# Patient Record
Sex: Female | Born: 1985 | Race: Asian | Hispanic: No | Marital: Married | Smoking: Never smoker
Health system: Southern US, Community
[De-identification: ages and names within clinical notes are randomized; demographics above are authoritative.]

## PROBLEM LIST (undated history)

## (undated) DIAGNOSIS — Z789 Other specified health status: Secondary | ICD-10-CM

## (undated) HISTORY — PX: OTHER SURGICAL HISTORY: SHX169

## (undated) HISTORY — PX: BREAST SURGERY: SHX581

---

## 2008-02-10 HISTORY — PX: BREAST SURGERY: SHX581

## 2014-02-08 ENCOUNTER — Ambulatory Visit (INDEPENDENT_AMBULATORY_CARE_PROVIDER_SITE_OTHER): Payer: Managed Care, Other (non HMO) | Admitting: Physician Assistant

## 2014-02-08 VITALS — BP 104/78 | HR 80 | Temp 98.3°F | Resp 16 | Ht 62.25 in | Wt 162.8 lb

## 2014-02-08 DIAGNOSIS — Z23 Encounter for immunization: Secondary | ICD-10-CM

## 2014-02-08 DIAGNOSIS — Z0000 Encounter for general adult medical examination without abnormal findings: Secondary | ICD-10-CM

## 2014-02-08 DIAGNOSIS — E663 Overweight: Secondary | ICD-10-CM

## 2014-02-08 LAB — POCT URINALYSIS DIPSTICK
BILIRUBIN UA: NEGATIVE
Blood, UA: NEGATIVE
GLUCOSE UA: NEGATIVE
KETONES UA: NEGATIVE
LEUKOCYTES UA: NEGATIVE
NITRITE UA: NEGATIVE
PROTEIN UA: NEGATIVE
Spec Grav, UA: 1.01
UROBILINOGEN UA: 0.2
pH, UA: 7

## 2014-02-08 LAB — LIPID PANEL
CHOLESTEROL: 204 mg/dL — AB (ref 0–200)
HDL: 53 mg/dL (ref >39)
LDL CALC: 102 mg/dL — AB (ref 0–99)
TRIGLYCERIDES: 243 mg/dL — AB (ref <150)
Total CHOL/HDL Ratio: 3.8 Ratio
VLDL: 49 mg/dL — ABNORMAL HIGH (ref 0–40)

## 2014-02-08 LAB — CBC WITH DIFFERENTIAL/PLATELET
BASOS PCT: 0 % (ref 0–1)
Basophils Absolute: 0 10*3/uL (ref 0.0–0.1)
Eosinophils Absolute: 0.1 10*3/uL (ref 0.0–0.7)
Eosinophils Relative: 1 % (ref 0–5)
HCT: 37.9 % (ref 36.0–46.0)
HEMOGLOBIN: 12.9 g/dL (ref 12.0–15.0)
Lymphocytes Relative: 36 % (ref 12–46)
Lymphs Abs: 3.1 10*3/uL (ref 0.7–4.0)
MCH: 25.1 pg — ABNORMAL LOW (ref 26.0–34.0)
MCHC: 34 g/dL (ref 30.0–36.0)
MCV: 73.9 fL — ABNORMAL LOW (ref 78.0–100.0)
MONO ABS: 0.4 10*3/uL (ref 0.1–1.0)
MONOS PCT: 5 % (ref 3–12)
MPV: 10.3 fL (ref 8.6–12.4)
Neutro Abs: 4.9 10*3/uL (ref 1.7–7.7)
Neutrophils Relative %: 58 % (ref 43–77)
Platelets: 389 10*3/uL (ref 150–400)
RBC: 5.13 MIL/uL — ABNORMAL HIGH (ref 3.87–5.11)
RDW: 14.1 % (ref 11.5–15.5)
WBC: 8.5 10*3/uL (ref 4.0–10.5)

## 2014-02-08 LAB — COMPREHENSIVE METABOLIC PANEL
ALBUMIN: 3.8 g/dL (ref 3.5–5.2)
ALT: 22 U/L (ref 0–35)
AST: 16 U/L (ref 0–37)
Alkaline Phosphatase: 96 U/L (ref 39–117)
BILIRUBIN TOTAL: 0.4 mg/dL (ref 0.2–1.2)
BUN: 8 mg/dL (ref 6–23)
CO2: 25 mEq/L (ref 19–32)
Calcium: 9.4 mg/dL (ref 8.4–10.5)
Chloride: 104 mEq/L (ref 96–112)
Creat: 0.9 mg/dL (ref 0.50–1.10)
Glucose, Bld: 86 mg/dL (ref 70–99)
Potassium: 4.3 mEq/L (ref 3.5–5.3)
SODIUM: 138 meq/L (ref 135–145)
TOTAL PROTEIN: 7.7 g/dL (ref 6.0–8.3)

## 2014-02-08 NOTE — Patient Instructions (Signed)
Try to walk for 30 minutes 3-4 times a day and work on weight loss. You only need a pap smear every 3 years. I will call you with your lab results within 5-10 days. Return with any concerns.

## 2014-02-08 NOTE — Progress Notes (Signed)
The patient was discussed with me and I agree with the diagnosis and treatment plan.  

## 2014-02-08 NOTE — Progress Notes (Signed)
Subjective:    Patient ID: Margie EgeSanjukta Mallik, female    DOB: 02/10/1985, 28 y.o.   MRN: [id number]  HPI  This is a 28 year old female with no PMH who is presenting for annual exam.   Complaints: none. States she had some right breast pain 6 months that has improved since changing bras. Wants her breasts to be checked.  LMP: one week ago.  Contraception: yes on OCPs- cannot recall name of OCP. Last pap: last pap 6 months and normal. Never had abnormal. Immunizations: does not take injections. (returned after left facility and had decided she wanted the flu vaccine) Dentist: has dental insurance but has not had an exam in many years. Eye: regular exams. Diet/exercise: eats healthy, does not exercise. Fam hx: Mom with HTN. No history of heart disease or cancers. Substance use:she does not smoke, uses rare alcohol, no recreational drugs.   Review of Systems  Constitutional: Negative.   HENT: Negative.   Eyes: Negative.   Respiratory: Negative.   Cardiovascular: Negative.   Gastrointestinal: Negative.   Endocrine: Negative.   Genitourinary: Negative.   Musculoskeletal: Negative.   Skin: Negative.   Allergic/Immunologic: Negative.   Neurological: Negative.   Psychiatric/Behavioral: Negative.    There are no active problems to display for this patient.  Home meds: unknown OCP  Allergies  Allergen Reactions  . Penicillins    Patient's social and family history were reviewed.     Objective:   Physical Exam  Constitutional: She is oriented to person, place, and time. She appears well-developed and well-nourished. No distress.  HENT:  Head: Normocephalic and atraumatic.  Right Ear: Hearing, tympanic membrane, external ear and ear canal normal.  Left Ear: Hearing, tympanic membrane, external ear and ear canal normal.  Nose: Nose normal.  Mouth/Throat: Uvula is midline, oropharynx is clear and moist and mucous membranes are normal.  Eyes: Conjunctivae, EOM and lids are  normal. Pupils are equal, round, and reactive to light. Right eye exhibits no discharge. Left eye exhibits no discharge. No scleral icterus.  Cardiovascular: Normal rate, regular rhythm, normal heart sounds, intact distal pulses and normal pulses.   No murmur heard. Pulmonary/Chest: Effort normal and breath sounds normal. No respiratory distress. She has no wheezes. She has no rhonchi. She has no rales.  Abdominal: Soft. Normal appearance and bowel sounds are normal. There is no tenderness.  Genitourinary: No breast swelling, tenderness, discharge or bleeding.  Musculoskeletal: Normal range of motion.  Lymphadenopathy:    She has no cervical adenopathy.  Neurological: She is alert and oriented to person, place, and time. She has normal strength and normal reflexes. No sensory deficit.  Skin: Skin is warm, dry and intact. No lesion and no rash noted.  Psychiatric: She has a normal mood and affect. Her speech is normal and behavior is normal. Thought content normal.   Results for orders placed or performed in visit on 02/08/14  POCT urinalysis dipstick  Result Value Ref Range   Color, UA yellow    Clarity, UA clear    Glucose, UA neg    Bilirubin, UA neg    Ketones, UA neg    Spec Grav, UA 1.010    Blood, UA neg    pH, UA 7.0    Protein, UA neg    Urobilinogen, UA 0.2    Nitrite, UA neg    Leukocytes, UA Negative       Assessment & Plan:  1. Annual physical exam 2. Overweight Advised that she  get regular dental exams and exercise 30 minutes 3-4 times a week. She denies Tdap. She is otherwise up to date on preventative medicine. She will return with any concerns.   - CBC with Differential - POCT urinalysis dipstick - Comprehensive metabolic panel - Lipid panel - TSH  3. Need for prophylactic vaccination and inoculation against influenza - Flu Vaccine QUAD 36+ mos IM   Roswell MinersNicole V. Dyke BrackettBush, PA-C, MHS [facility]  02/08/2014

## 2014-02-09 LAB — TSH: TSH: 2.127 u[IU]/mL (ref 0.350–4.500)

## 2014-02-13 ENCOUNTER — Encounter: Payer: Self-pay | Admitting: Radiology

## 2014-02-28 ENCOUNTER — Ambulatory Visit (INDEPENDENT_AMBULATORY_CARE_PROVIDER_SITE_OTHER): Payer: Managed Care, Other (non HMO) | Admitting: Family Medicine

## 2014-02-28 VITALS — BP 118/68 | HR 84 | Temp 98.5°F | Resp 17 | Ht 62.0 in | Wt 166.0 lb

## 2014-02-28 DIAGNOSIS — K648 Other hemorrhoids: Secondary | ICD-10-CM

## 2014-02-28 DIAGNOSIS — K625 Hemorrhage of anus and rectum: Secondary | ICD-10-CM

## 2014-02-28 MED ORDER — HYDROCORTISONE 1 % RE CREA: TOPICAL_CREAM | RECTAL | Status: DC

## 2014-02-28 NOTE — Patient Instructions (Signed)
We are going to treat you for a hemorrhoid with hydrocortisone cream as needed.  It is also important that you keep your stools soft let me know if you do not feel better!

## 2014-02-28 NOTE — Progress Notes (Signed)
[facility] [address], [place] [address] [phone]  Date:  02/28/2014   Name:  Tammy Mcdowell   DOB:  08/21/1985   MRN:  [id number]  PCP:  No PCP Per Patient    Chief Complaint: fistula; Blood In Stools; and Constipation   History of Present Illness:  Tammy Mcdowell is a 29 y.o. very pleasant female patient who presents with the following:  Here today with a bowel concern.   She and her husband report that she has a "fistula" which was first noted about 5 years ago and resolved with "ointment."   This seems to be related to constipation.   Questioned her further about this "fistula.'  She did not reuqire any surgery and treated this with external ointments.  It seems uncertain if she really had a fistula- perhaps this was a hemorrhoid.  She was never dx with a fistula but they just decided this must be what she had based on internet research.  It seems very doubtful that she actually has or had a fistula.   She has noted some rectal bleeding for the last week - bright red- with BM only.  It seemed to occur with hard stools at first, but now her stools are softer so it is better.  She has noted this problem on occasion for the last several years, it always would go aw  She does not have a GI doctor.     She has her menses currently No vomiting  She is married but does not have children   There are no active problems to display for this patient.   No past medical history on file.  Past Surgical History  Procedure Laterality Date  . Breast surgery      History  Substance Use Topics  . Smoking status: Never Smoker   . Smokeless tobacco: Not on file  . Alcohol Use: Not on file    No family history on file.  Allergies  Allergen Reactions  . Penicillins     Medication list has been reviewed and updated.  No current outpatient prescriptions on file prior to visit.   No current facility-administered medications on file prior to visit.     Review of Systems:  As per HPI- otherwise negative. Explained what a fistula actually is- It does not seem that she had one   Physical Examination: Filed Vitals:   02/28/14 1154  BP: 118/68  Pulse: 84  Temp: 98.5 F (36.9 C)  Resp: 17   Filed Vitals:   02/28/14 1154  Height: 5\' 2"  (1.575 m)  Weight: 166 lb (75.297 kg)   Body mass index is 30.35 kg/(m^2). Ideal Body Weight: Weight in (lb) to have BMI = 25: 136.4  GEN: WDWN, NAD, Non-toxic, A & O x 3 HEENT: Atraumatic, Normocephalic. Neck supple. No masses, No LAD. Ears and Nose: No external deformity. CV: RRR, No M/G/R. No JVD. No thrill. No extra heart sounds. PULM: CTA B, no wheezes, crackles, rhonchi. No retractions. No resp. distress. No accessory muscle use. ABD: S, NT, ND, +BS. No rebound. No HSM.  Benign exam EXTR: No c/c/e NEURO Normal gait.  PSYCH: Normally interactive. Conversant. Not depressed or anxious appearing.  Calm demeanor.  Rectal: tiny hemorrhoids at 3:00 and 9:00.  She is not able to tolerate more than a fingertip rectal exam due to pain but there is no gross bleeding  Assessment and Plan: Other hemorrhoids - Plan: hydrocortisone (PROCTOCORT) 1 % CREA, Ambulatory  referral to Gastroenterology  Rectal bleeding - Plan: Ambulatory referral to Gastroenterology  Will refer her to GI as she has noted intermittent rectal bleeding for a few years (likely due to hemorrhoids).  proctocort to use as needed, encouraged her to keep her stools soft to prevent pain and discourage bleeding.   Signed Abbe Amsterdam, MD

## 2014-03-01 MED ORDER — HYDROCORTISONE 2.5 % RE CREA: 1.0000 "application " | TOPICAL_CREAM | Freq: Two times a day (BID) | RECTAL | Status: DC

## 2014-03-01 NOTE — Addendum Note (Signed)
Addended by: Eddie CandleLUCK, Jhaden Pizzuto L on: 03/01/2014 11:35 AM   Modules accepted: Orders

## 2014-07-16 ENCOUNTER — Ambulatory Visit (INDEPENDENT_AMBULATORY_CARE_PROVIDER_SITE_OTHER): Payer: Managed Care, Other (non HMO) | Admitting: Family Medicine

## 2014-07-16 VITALS — BP 100/60 | HR 83 | Temp 97.5°F | Resp 16 | Ht 62.0 in | Wt 165.0 lb

## 2014-07-16 DIAGNOSIS — S060X1A Concussion with loss of consciousness of 30 minutes or less, initial encounter: Secondary | ICD-10-CM

## 2014-07-16 DIAGNOSIS — R51 Headache: Secondary | ICD-10-CM | POA: Diagnosis not present

## 2014-07-16 DIAGNOSIS — R110 Nausea: Secondary | ICD-10-CM

## 2014-07-16 DIAGNOSIS — R519 Headache, unspecified: Secondary | ICD-10-CM
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
## 2014-07-16 NOTE — Patient Instructions (Signed)
Watch for any sign of neurologic changes as discussed.  Acetaminophen 500 mg 2 pills 3 times daily if needed for headache  Concussion A concussion, or closed-head injury, is a brain injury caused by a direct blow to the head or by a quick and sudden movement (jolt) of the head or neck. Concussions are usually not life-threatening. Even so, the effects of a concussion can be serious. If you have had a concussion before, you are more likely to experience concussion-like symptoms after a direct blow to the head.  CAUSES  Direct blow to the head, such as from running into another player during a soccer game, being hit in a fight, or hitting your head on a hard surface.  A jolt of the head or neck that causes the brain to move back and forth inside the skull, such as in a car crash. SIGNS AND SYMPTOMS The signs of a concussion can be hard to notice. Early on, they may be missed by you, family members, and health care providers. You may look fine but act or feel differently. Symptoms are usually temporary, but they may last for days, weeks, or even longer. Some symptoms may appear right away while others may not show up for hours or days. Every head injury is different. Symptoms include:  Mild to moderate headaches that will not go away.  A feeling of pressure inside your head.  Having more trouble than usual:  Learning or remembering things you have heard.  Answering questions.  Paying attention or concentrating.  Organizing daily tasks.  Making decisions and solving problems.  Slowness in thinking, acting or reacting, speaking, or reading.  Getting lost or being easily confused.  Feeling tired all the time or lacking energy (fatigued).  Feeling drowsy.  Sleep disturbances.  Sleeping more than usual.  Sleeping less than usual.  Trouble falling asleep.  Trouble sleeping (insomnia).  Loss of balance or feeling lightheaded or dizzy.  Nausea or vomiting.  Numbness or  tingling.  Increased sensitivity to:  Sounds.  Lights.  Distractions.  Vision problems or eyes that tire easily.  Diminished sense of taste or smell.  Ringing in the ears.  Mood changes such as feeling sad or anxious.  Becoming easily irritated or angry for little or no reason.  Lack of motivation.  Seeing or hearing things other people do not see or hear (hallucinations). DIAGNOSIS Your health care provider can usually diagnose a concussion based on a description of your injury and symptoms. He or she will ask whether you passed out (lost consciousness) and whether you are having trouble remembering events that happened right before and during your injury. Your evaluation might include:  A brain scan to look for signs of injury to the brain. Even if the test shows no injury, you may still have a concussion.  Blood tests to be sure other problems are not present. TREATMENT  Concussions are usually treated in an emergency department, in urgent care, or at a clinic. You may need to stay in the hospital overnight for further treatment.  Tell your health care provider if you are taking any medicines, including prescription medicines, over-the-counter medicines, and natural remedies. Some medicines, such as blood thinners (anticoagulants) and aspirin, may increase the chance of complications. Also tell your health care provider whether you have had alcohol or are taking illegal drugs. This information may affect treatment.  Your health care provider will send you home with important instructions to follow.  How fast you will recover  from a concussion depends on many factors. These factors include how severe your concussion is, what part of your brain was injured, your age, and how healthy you were before the concussion.  Most people with mild injuries recover fully. Recovery can take time. In general, recovery is slower in older persons. Also, persons who have had a concussion in  the past or have other medical problems may find that it takes longer to recover from their current injury. HOME CARE INSTRUCTIONS General Instructions  Carefully follow the directions your health care provider gave you.  Only take over-the-counter or prescription medicines for pain, discomfort, or fever as directed by your health care provider.  Take only those medicines that your health care provider has approved.  Do not drink alcohol until your health care provider says you are well enough to do so. Alcohol and certain other drugs may slow your recovery and can put you at risk of further injury.  If it is harder than usual to remember things, write them down.  If you are easily distracted, try to do one thing at a time. For example, do not try to watch TV while fixing dinner.  Talk with family members or close friends when making important decisions.  Keep all follow-up appointments. Repeated evaluation of your symptoms is recommended for your recovery.  Watch your symptoms and tell others to do the same. Complications sometimes occur after a concussion. Older adults with a brain injury may have a higher risk of serious complications, such as a blood clot on the brain.  Tell your teachers, school nurse, school counselor, coach, athletic trainer, or work Freight forwarder about your injury, symptoms, and restrictions. Tell them about what you can or cannot do. They should watch for:  Increased problems with attention or concentration.  Increased difficulty remembering or learning new information.  Increased time needed to complete tasks or assignments.  Increased irritability or decreased ability to cope with stress.  Increased symptoms.  Rest. Rest helps the brain to heal. Make sure you:  Get plenty of sleep at night. Avoid staying up late at night.  Keep the same bedtime hours on weekends and weekdays.  Rest during the day. Take daytime naps or rest breaks when you feel  tired.  Limit activities that require a lot of thought or concentration. These include:  Doing homework or job-related work.  Watching TV.  Working on the computer.  Avoid any situation where there is potential for another head injury (football, hockey, soccer, basketball, martial arts, downhill snow sports and horseback riding). Your condition will get worse every time you experience a concussion. You should avoid these activities until you are evaluated by the appropriate follow-up health care providers. Returning To Your Regular Activities You will need to return to your normal activities slowly, not all at once. You must give your body and brain enough time for recovery.  Do not return to sports or other athletic activities until your health care provider tells you it is safe to do so.  Ask your health care provider when you can drive, ride a bicycle, or operate heavy machinery. Your ability to react may be slower after a brain injury. Never do these activities if you are dizzy.  Ask your health care provider about when you can return to work or school. Preventing Another Concussion It is very important to avoid another brain injury, especially before you have recovered. In rare cases, another injury can lead to permanent brain damage, brain swelling, or  death. The risk of this is greatest during the first 7-10 days after a head injury. Avoid injuries by:  Wearing a seat belt when riding in a car.  Drinking alcohol only in moderation.  Wearing a helmet when biking, skiing, skateboarding, skating, or doing similar activities.  Avoiding activities that could lead to a second concussion, such as contact or recreational sports, until your health care provider says it is okay.  Taking safety measures in your home.  Remove clutter and tripping hazards from floors and stairways.  Use grab bars in bathrooms and handrails by stairs.  Place non-slip mats on floors and in  bathtubs.  Improve lighting in dim areas. SEEK MEDICAL CARE IF:  You have increased problems paying attention or concentrating.  You have increased difficulty remembering or learning new information.  You need more time to complete tasks or assignments than before.  You have increased irritability or decreased ability to cope with stress.  You have more symptoms than before. Seek medical care if you have any of the following symptoms for more than 2 weeks after your injury:  Lasting (chronic) headaches.  Dizziness or balance problems.  Nausea.  Vision problems.  Increased sensitivity to noise or light.  Depression or mood swings.  Anxiety or irritability.  Memory problems.  Difficulty concentrating or paying attention.  Sleep problems.  Feeling tired all the time. SEEK IMMEDIATE MEDICAL CARE IF:  You have severe or worsening headaches. These may be a sign of a blood clot in the brain.  You have weakness (even if only in one hand, leg, or part of the face).  You have numbness.  You have decreased coordination.  You vomit repeatedly.  You have increased sleepiness.  One pupil is larger than the other.  You have convulsions.  You have slurred speech.  You have increased confusion. This may be a sign of a blood clot in the brain.  You have increased restlessness, agitation, or irritability.  You are unable to recognize people or places.  You have neck pain.  It is difficult to wake you up.  You have unusual behavior changes.  You lose consciousness. MAKE SURE YOU:  Understand these instructions.  Will watch your condition.  Will get help right away if you are not doing well or get worse. Document Released: 04/18/2003 Document Revised: 01/31/2013 Document Reviewed: 08/18/2012 [facility] Patient Information 2015 [place], [place]. This information is not intended to replace advice given to you by your health care provider. Make sure you discuss any  questions you have with your health care provider.

## 2014-07-18 ENCOUNTER — Ambulatory Visit (INDEPENDENT_AMBULATORY_CARE_PROVIDER_SITE_OTHER): Payer: Managed Care, Other (non HMO) | Admitting: Physician Assistant

## 2014-07-18 VITALS — BP 110/60 | HR 78 | Temp 98.4°F | Resp 14 | Ht 62.25 in | Wt 167.0 lb

## 2014-07-18 DIAGNOSIS — Z09 Encounter for follow-up examination after completed treatment for conditions other than malignant neoplasm: Secondary | ICD-10-CM

## 2014-07-18 DIAGNOSIS — F0781 Postconcussional syndrome: Secondary | ICD-10-CM | POA: Diagnosis not present

## 2014-07-18 NOTE — Patient Instructions (Signed)
Please continue to stay hydrated with water and eat well. Cognitive rest--avoiding loud noise, stressors, visual stimuli--has been very beneficial to you. Please contact us if you have questions or concerns.

## 2014-07-21 ENCOUNTER — Encounter: Payer: Self-pay | Admitting: Physician Assistant

## 2014-07-21 NOTE — Progress Notes (Signed)
[facility] [address], [place] [address] [phone]  Date:  07/18/2014   Name:  Tammy Mcdowell   DOB:  1985-08-16   MRN:  [id number]  PCP:  No PCP Per Patient    History of Present Illness:  Tammy Mcdowell is a 29 y.o. female patient who presents to [facility] for follow-up after a fall in her home which resulted in some postconcussive symptoms. She has been off work for the last 48 hours. Patient reports that she is feeling a lot better. She has been resting very well. She states that she has some moments of disequilibrium, but had it has not resulted in any falls. She is no longer having headaches. And she does not have the nausea as before. Husband confirms that she has not been displaying disorientation, confusion, slurred speech, agitation, dizziness, cognitive delay, or any falls. She is eating normally. She is concerned about returning to work with this disequilibrium and having to walk from the parking lot into her job and sitting at the computer for long hours of time.   There are no active problems to display for this patient.   History reviewed. No pertinent past medical history.  Past Surgical History  Procedure Laterality Date  . Breast surgery    . Breast surgery  2010    History  Substance Use Topics  . Smoking status: Never Smoker   . Smokeless tobacco: Not on file  . Alcohol Use: Not on file    History reviewed. No pertinent family history.  Allergies  Allergen Reactions  . Penicillins     Medication list has been reviewed and updated.  Current Outpatient Prescriptions on File Prior to Visit  Medication Sig Dispense Refill  . hydrocortisone (PROCTOCORT) 1 % CREA Apply 2-4x a day as needed for hemorrhoids (Patient not taking: Reported on 07/16/2014) 1 Tube 1  . hydrocortisone (PROCTOCREAM-HC) 2.5 % rectal cream Place 1 application rectally 2 (two) times daily. (Patient not taking: Reported on 07/16/2014) 30 g 0   No current  facility-administered medications on file prior to visit.    ROS ROS otherwise unremarkable unless listed above  Physical Examination: BP 110/60 mmHg  Pulse 78  Temp(Src) 98.4 F (36.9 C) (Oral)  Resp 14  Ht 5' 2.25" (1.581 m)  Wt 167 lb (75.751 kg)  BMI 30.31 kg/m2  SpO2 98%  LMP 07/14/2014 Ideal Body Weight: Weight in (lb) to have BMI = 25: 137.5  Physical Exam  Constitutional: She is oriented to person, place, and time. She appears well-developed and well-nourished.  HENT:  Head: Normocephalic and atraumatic.  No tenderness   Eyes: Conjunctivae and EOM are normal. Pupils are equal, round, and reactive to light. Right eye exhibits normal extraocular motion and no nystagmus. Left eye exhibits normal extraocular motion and no nystagmus.  Neck: Normal range of motion.  Cardiovascular: Normal rate, regular rhythm and normal heart sounds.  Exam reveals no friction rub.   No murmur heard. Pulmonary/Chest: Effort normal and breath sounds normal. No respiratory distress.  Neurological: She is alert and oriented to person, place, and time. No cranial nerve deficit. She displays a negative Romberg sign. Coordination normal.  Normal finger to nose.  Normal H to S.  Tandem gait intact.    Skin: Skin is warm and dry.  Psychiatric: She has a normal mood and affect. Her speech is normal and behavior is normal. Judgment and thought content normal. Cognition and memory are normal. She is attentive.  Assessment and Plan: 29 year old female who is otherwise healthy is here today for follow-up of a fall that occurred about 3 days ago. She is cognitively and neurologically sound with great improvement. At this time, she is able to go to work, however due to the mild disequilibrium I have advised that she continue her cognitive rest. She is very concerned that she needs to return to work, however can do work at home. I have sent a letter with her advising my recommendation that she work from  home for the next 2 days, where she can be able to take more breaks, avoid heavy stressors, and a loud work-noise environment. This will be for the next 2 days and then for the following 2 days she can rest until Monday morning. She agrees with this plan, and will return to clinic if her symptoms failed to improve, or worsen. Follow up  Post concussive syndrome   Trena Platt, PA-C [facility] 07/21/2014 10:32 AM

## 2014-12-29 ENCOUNTER — Ambulatory Visit (INDEPENDENT_AMBULATORY_CARE_PROVIDER_SITE_OTHER): Payer: Managed Care, Other (non HMO) | Admitting: Internal Medicine

## 2014-12-29 VITALS — BP 110/78 | HR 89 | Temp 98.6°F | Resp 18 | Ht 62.25 in | Wt 162.2 lb

## 2014-12-29 DIAGNOSIS — R062 Wheezing: Secondary | ICD-10-CM | POA: Diagnosis not present

## 2014-12-29 DIAGNOSIS — J302 Other seasonal allergic rhinitis: Secondary | ICD-10-CM | POA: Diagnosis not present

## 2014-12-29 DIAGNOSIS — K649 Unspecified hemorrhoids: Secondary | ICD-10-CM | POA: Diagnosis not present

## 2014-12-29 DIAGNOSIS — K921 Melena: Secondary | ICD-10-CM

## 2014-12-29 DIAGNOSIS — K5900 Constipation, unspecified: Secondary | ICD-10-CM

## 2014-12-29 DIAGNOSIS — J4520 Mild intermittent asthma, uncomplicated: Secondary | ICD-10-CM | POA: Diagnosis not present

## 2014-12-29 DIAGNOSIS — Z6829 Body mass index (BMI) 29.0-29.9, adult: Secondary | ICD-10-CM | POA: Diagnosis not present

## 2014-12-29 DIAGNOSIS — Z0000 Encounter for general adult medical examination without abnormal findings: Secondary | ICD-10-CM | POA: Diagnosis not present

## 2014-12-29 DIAGNOSIS — J45909 Unspecified asthma, uncomplicated: Secondary | ICD-10-CM | POA: Insufficient documentation

## 2014-12-29 LAB — POCT CBC
GRANULOCYTE PERCENT: 47.6 % (ref 37–80)
HEMATOCRIT: 39.3 % (ref 37.7–47.9)
Hemoglobin: 12.8 g/dL (ref 12.2–16.2)
Lymph, poc: 1.9 (ref 0.6–3.4)
MCH: 24.7 pg — AB (ref 27–31.2)
MCHC: 32.6 g/dL (ref 31.8–35.4)
MCV: 75.6 fL — AB (ref 80–97)
MID (cbc): 0.5 (ref 0–0.9)
MPV: 8.8 fL (ref 0–99.8)
POC GRANULOCYTE: 2.1 (ref 2–6.9)
POC LYMPH PERCENT: 41.8 %L (ref 10–50)
POC MID %: 10.6 % (ref 0–12)
Platelet Count, POC: 242 10*3/uL (ref 142–424)
RBC: 5.2 M/uL (ref 4.04–5.48)
RDW, POC: 15 %
WBC: 4.5 10*3/uL — AB (ref 4.6–10.2)

## 2014-12-29 LAB — TSH: TSH: 1.984 u[IU]/mL (ref 0.350–4.500)

## 2014-12-29 LAB — LIPID PANEL
CHOL/HDL RATIO: 4.4 ratio (ref <=5.0)
Cholesterol: 181 mg/dL (ref 125–200)
HDL: 41 mg/dL — AB (ref >=46)
LDL Cholesterol: 120 mg/dL (ref <130)
TRIGLYCERIDES: 101 mg/dL (ref <150)
VLDL: 20 mg/dL (ref <30)

## 2014-12-29 LAB — POCT SEDIMENTATION RATE: POCT SED RATE: 40 mm/h — AB (ref 0–22)

## 2014-12-29 MED ORDER — ALBUTEROL SULFATE HFA 108 (90 BASE) MCG/ACT IN AERS: 2.0000 | INHALATION_SPRAY | Freq: Four times a day (QID) | RESPIRATORY_TRACT | Status: DC | PRN

## 2014-12-29 MED ORDER — MONTELUKAST SODIUM 10 MG PO TABS: 10.0000 mg | ORAL_TABLET | Freq: Every day | ORAL | Status: DC

## 2014-12-29 MED ORDER — BECLOMETHASONE DIPROPIONATE 40 MCG/ACT IN AERS: 2.0000 | INHALATION_SPRAY | Freq: Every day | RESPIRATORY_TRACT | Status: DC

## 2014-12-29 NOTE — Progress Notes (Signed)
Subjective:    Patient ID: Tammy Mcdowell, female    DOB: 04-22-1985, 29 y.o.   MRN: [id number] This chart was scribed for Ellamae Sia, MD by Jolene Provost, Medical Scribe. This patient was seen in Room 12 and the patient's care was started a 10:24 AM.  Chief Complaint  Patient presents with  . Annual Exam    CPE without pelvic exam    HPI HPI Comments: Tammy Mcdowell is a 29 y.o. female who presents to [facility] for a full physical. She states she has been generally healthy though she is complaining of cough, sneezing and congestion for the last 3-4 days with associated intermittent fever. She also states she has intermittent chest tightness, sneezing and wheezing. She was diagnosed with asthma when she lived in [place], and many members of her own family have asthma. She denies swelling in extremities. She denies ear pain. She does not exercise. She states she is overweight because she eats a lot of cookies and cakes.  She also states she has intermittent constipation, but it is not a new sx. She has suffered form constipation as long as she can remember. She also has blood in her stool intermittently. She has a past hx of hemorrhoids. She has had constipation both while living in [place] and since she has lived in the [place].   FH--She states her mother has heart disease and takes medication for her sx. She is not sure exactly what her mother's heart issue is. Her mother lives in [place].  Many fam mem w/ asthma  She works at [employer]. Here w/ husband/no kids yet!  Review of Systems  Constitutional: Positive for fever. Negative for chills, activity change, appetite change, fatigue and unexpected weight change.  HENT: Positive for sneezing. Negative for trouble swallowing.   Eyes: Negative for photophobia and visual disturbance.  Respiratory: Positive for cough and wheezing. Negative for choking.   Cardiovascular: Negative for chest pain, palpitations and leg swelling.    Gastrointestinal: Positive for constipation. Negative for nausea, abdominal pain and diarrhea.  Genitourinary: Negative for frequency, difficulty urinating and menstrual problem.  Musculoskeletal: Negative for back pain.  Neurological: Negative for dizziness, weakness and headaches.  Hematological: Does not bruise/bleed easily.  Psychiatric/Behavioral: Negative for sleep disturbance and dysphoric mood.       Objective:  BP 110/78 mmHg  Pulse 89  Temp(Src) 98.6 F (37 C) (Oral)  Resp 18  Ht 5' 2.25" (1.581 m)  Wt 162 lb 3.2 oz (73.573 kg)  BMI 29.43 kg/m2  SpO2 99%  LMP 12/18/2014  Physical Exam  Constitutional: She is oriented to person, place, and time. She appears well-developed and well-nourished. No distress.  HENT:  Head: Normocephalic and atraumatic.  Right Ear: External ear normal.  Left Ear: External ear normal.  Nose: Nose normal.  Mouth/Throat: Oropharynx is clear and moist.  Eyes: Conjunctivae and EOM are normal. Pupils are equal, round, and reactive to light.  Neck: Normal range of motion. Neck supple. No thyromegaly present.  Cardiovascular: Normal rate, regular rhythm, normal heart sounds and intact distal pulses.   No murmur heard. Pulmonary/Chest: Effort normal and breath sounds normal. No respiratory distress. She has no wheezes.  Abdominal: Soft. Bowel sounds are normal. She exhibits no distension and no mass. There is no tenderness. There is no rebound.  Musculoskeletal: Normal range of motion. She exhibits no edema or tenderness.  Lymphadenopathy:    She has no cervical adenopathy.  Neurological: She is alert and oriented to person,  place, and time. She has normal reflexes. No cranial nerve deficit. Coordination normal.  Skin: Skin is warm and dry. No rash noted. She is not diaphoretic.  Psychiatric: She has a normal mood and affect. Her behavior is normal. Judgment and thought content normal.  Nursing note and vitals reviewed.     Assessment & Plan:   Annual physical exam  Hematochezia - Plan: POCT CBC, POCT SEDIMENTATION RATE --currently stable  Hemorrhoids, unspecified hemorrhoid type - none today  Constipation, unspecified constipation type - Plan: TSH//gave HO--diet,miralax  Wheezing - Plan: POCT CBC Other seasonal allergic rhinitis --She has allergic asthma and rhinitis and needs consistent medication  BMI 29.0-29.9,adult - Plan: Lipid panel, TSH -We discussed the diet and exercise plan as her diet is rich in fats and carbohydrates  [place] this encounter  Medications  . FLUVIRIN SUSP    Sig: ADM 0.5ML IM UTD    Refill:  0  . albuterol (PROVENTIL HFA;VENTOLIN HFA) 108 (90 BASE) MCG/ACT inhaler    Sig: Inhale 2 puffs into the lungs every 6 (six) hours as needed for wheezing or shortness of breath.    Dispense:  1 Inhaler    Refill:  3  . beclomethasone (QVAR) 40 MCG/ACT inhaler    Sig: Inhale 2 puffs into the lungs daily. To prevent asthma    Dispense:  1 Inhaler    Refill:  12  . montelukast (SINGULAIR) 10 MG tablet    Sig: Take 1 tablet (10 mg total) by mouth at bedtime. To prevent asthma and allergies    Dispense:  30 tablet    Refill:  11  fu 3 mos-sooner if not contr See HO--gave fiber foods as well/OK for miralax I have completed the patient encounter in its entirety as documented by the scribe, with editing by me where necessary. Charika Mikelson P. Merla Richesoolittle, M.D.    By signing my name below, I, Javier Dockerobert Ryan Halas, attest that this documentation has been prepared under the direction and in the presence of Ellamae Siaobert Sou Nohr, MD. Electronically Signed: Javier Dockerobert Ryan Halas, ER Scribe. 12/29/2014. 10:22 AM.

## 2014-12-29 NOTE — Patient Instructions (Signed)
Constipation, Adult Constipation is when a person has fewer than three bowel movements a week, has difficulty having a bowel movement, or has stools that are dry, hard, or larger than normal. As people grow older, constipation is more common. A low-fiber diet, not taking in enough fluids, and taking certain medicines may make constipation worse.  CAUSES   Certain medicines, such as antidepressants, pain medicine, iron supplements, antacids, and water pills.   Certain diseases, such as diabetes, irritable bowel syndrome (IBS), thyroid disease, or depression.   Not drinking enough water.   Not eating enough fiber-rich foods.   Stress or travel.   Lack of physical activity or exercise.   Ignoring the urge to have a bowel movement.   Using laxatives too much.  SIGNS AND SYMPTOMS   Having fewer than three bowel movements a week.   Straining to have a bowel movement.   Having stools that are hard, dry, or larger than normal.   Feeling full or bloated.   Pain in the lower abdomen.   Not feeling relief after having a bowel movement.  Treatment will depend on the severity of your constipation and what is causing it. Some dietary treatments include drinking more fluids and eating more fiber-rich foods. Lifestyle treatments may include regular exercise. If these diet and lifestyle recommendations do not help, your health care provider may recommend taking over-the-counter laxative medicines to help you have bowel movements. Prescription medicines may be prescribed if over-the-counter medicines do not work.  HOME CARE INSTRUCTIONS   Eat foods that have a lot of fiber, such as fruits, vegetables, whole grains, and beans.  Limit foods high in fat and processed sugars, such as french fries, hamburgers, cookies, candies, and soda.   A fiber supplement may be added to your diet if you cannot get enough fiber from foods.   Drink enough fluids to keep your urine clear or pale  yellow.   Exercise regularly or as directed by your health care provider.   Go to the restroom when you have the urge to go. Do not hold it.   Only take over-the-counter or prescription medicines as directed by your health care provider. Do not take other medicines for constipation without talking to your health care provider first.   See fiber handout  Asthma, Adult Asthma is a recurring condition in which the airways tighten and narrow. Asthma can make it difficult to breathe. It can cause coughing, wheezing, and shortness of breath. Asthma episodes, also called asthma attacks, range from minor to life-threatening. Asthma cannot be cured, but medicines and lifestyle changes can help control it. CAUSES Asthma is believed to be caused by inherited (genetic) and environmental factors, but its exact cause is unknown. Asthma may be triggered by allergens, lung infections, or irritants in the air. Asthma triggers are different for each person. Common triggers include:   Animal dander.  Dust mites.  Cockroaches.  Pollen from trees or grass.  Mold.  Smoke.  Air pollutants such as dust, household cleaners, hair sprays, aerosol sprays, paint fumes, strong chemicals, or strong odors.  Cold air, weather changes, and winds (which increase molds and pollens in the air).  Strong emotional expressions such as crying or laughing hard.  Stress.  Certain medicines (such as aspirin) or types of drugs (such as beta-blockers).  Sulfites in foods and drinks. Foods and drinks that may contain sulfites include dried fruit, potato chips, and sparkling grape juice.  Infections or inflammatory conditions such as the flu, a  cold, or an inflammation of the nasal membranes (rhinitis).  Gastroesophageal reflux disease (GERD).  Exercise or strenuous activity. SYMPTOMS Symptoms may occur immediately after asthma is triggered or many hours later. Symptoms include:  Wheezing.  Excessive nighttime  or early morning coughing.  Frequent or severe coughing with a common cold.  Chest tightness.  Shortness of breath. DIAGNOSIS  The diagnosis of asthma is made by a review of your medical history and a physical exam. Tests may also be performed. These may include:  Lung function studies. These tests show how much air you breathe in and out.  Allergy tests.  Imaging tests such as X-rays. TREATMENT  Asthma cannot be cured, but it can usually be controlled. Treatment involves identifying and avoiding your asthma triggers. It also involves medicines. There are 2 classes of medicine used for asthma treatment:   Controller medicines Like QVAR and SINGULAIR. These prevent asthma symptoms from occurring. They are usually taken every day.  Reliever or rescue medicines like ALBUTEROL INHALER. These quickly relieve asthma symptoms. They are used as needed and provide short-term relief.  HOME CARE INSTRUCTIONS   Take medicines only as directed by your health care provider. Speak with your health care provider if you have questions about how or when to take the medicines.    Control your home environment in the following ways to help prevent asthma attacks:  Do not smoke. Avoid being exposed to secondhand smoke.  Change your heating and air conditioning filter regularly.  Limit your use of fireplaces and wood stoves.  Get rid of pests (such as roaches and mice) and their droppings.  Throw away plants if you see mold on them.  Clean your floors and dust regularly. Use unscented cleaning products.  Try to have someone else vacuum for you regularly. Stay out of rooms while they are being vacuumed and for a short while afterward. If you vacuum, use a dust mask from a hardware store, a double-layered or microfilter vacuum cleaner bag, or a vacuum cleaner with a HEPA filter.  Replace carpet with wood, tile, or vinyl flooring. Carpet can trap dander and dust.  Use allergy-proof pillows,  mattress covers, and box spring covers.  Wash bed sheets and blankets every week in hot water and dry them in a dryer.  Use blankets that are made of polyester or cotton.  Clean bathrooms and kitchens with bleach. If possible, have someone repaint the walls in these rooms with mold-resistant paint. Keep out of the rooms that are being cleaned and painted.  Wash hands frequently. SEEK MEDICAL CARE IF:   You have wheezing, shortness of breath, or a cough even if taking medicine to prevent attacks.  The colored mucus you cough up (sputum) is thicker than usual.  Your sputum changes from clear or white to yellow, green, gray, or bloody.  You have any problems that may be related to the medicines you are taking (such as a rash, itching, swelling, or trouble breathing).  You are using a reliever medicine more than 2-3 times per week.  Your peak flow is still at 50-79% of your personal best after following your action plan for 1 hour.  You have a fever. SEEK IMMEDIATE MEDICAL CARE IF:   You seem to be getting worse and are unresponsive to treatment during an asthma attack.  You are short of breath even at rest.  You get short of breath when doing very little physical activity.  You have difficulty eating, drinking, or talking due  to asthma symptoms.  You develop chest pain.  You develop a fast heartbeat.  You have a bluish color to your lips or fingernails.  You are light-headed, dizzy, or faint.  Your peak flow is less than 50% of your personal best.   This information is not intended to replace advice given to you by your health care provider. Make sure you discuss any questions you have with your health care provider.   Document Released: 01/26/2005 Document Revised: 10/17/2014 Document Reviewed: 08/25/2012 Elsevier Interactive Patient Education [employer]2016 [employer] Inc.

## 2014-12-31 ENCOUNTER — Encounter: Payer: Self-pay | Admitting: Internal Medicine

## 2017-10-20 LAB — OB RESULTS CONSOLE RPR: RPR: NONREACTIVE

## 2017-10-20 LAB — OB RESULTS CONSOLE ABO/RH: RH Type: POSITIVE

## 2017-10-20 LAB — OB RESULTS CONSOLE HIV ANTIBODY (ROUTINE TESTING): HIV: NONREACTIVE

## 2017-10-20 LAB — OB RESULTS CONSOLE RUBELLA ANTIBODY, IGM: Rubella: IMMUNE

## 2017-10-20 LAB — OB RESULTS CONSOLE HEPATITIS B SURFACE ANTIGEN: Hepatitis B Surface Ag: NEGATIVE

## 2017-10-20 LAB — OB RESULTS CONSOLE GC/CHLAMYDIA
Chlamydia: NEGATIVE
Gonorrhea: NEGATIVE

## 2017-10-20 LAB — OB RESULTS CONSOLE ANTIBODY SCREEN: Antibody Screen: NEGATIVE

## 2018-04-30 ENCOUNTER — Inpatient Hospital Stay (HOSPITAL_BASED_OUTPATIENT_CLINIC_OR_DEPARTMENT_OTHER): Payer: 59

## 2018-04-30 ENCOUNTER — Encounter (HOSPITAL_COMMUNITY): Payer: Self-pay

## 2018-04-30 ENCOUNTER — Inpatient Hospital Stay (EMERGENCY_DEPARTMENT_HOSPITAL)
Admission: AD | Admit: 2018-04-30 | Discharge: 2018-04-30 | Disposition: A | Discharge status: Home / Self Care | Payer: 59 | Source: Home / Self Care | Attending: Obstetrics | Admitting: Obstetrics

## 2018-04-30 ENCOUNTER — Other Ambulatory Visit: Payer: Self-pay

## 2018-04-30 DIAGNOSIS — O368130 Decreased fetal movements, third trimester, not applicable or unspecified: Secondary | ICD-10-CM | POA: Diagnosis not present

## 2018-04-30 DIAGNOSIS — Z79899 Other long term (current) drug therapy: Secondary | ICD-10-CM | POA: Insufficient documentation

## 2018-04-30 DIAGNOSIS — Z3A37 37 weeks gestation of pregnancy: Secondary | ICD-10-CM

## 2018-04-30 DIAGNOSIS — R51 Headache: Secondary | ICD-10-CM

## 2018-04-30 DIAGNOSIS — O368131 Decreased fetal movements, third trimester, fetus 1: Secondary | ICD-10-CM | POA: Diagnosis not present

## 2018-04-30 DIAGNOSIS — O9989 Other specified diseases and conditions complicating pregnancy, childbirth and the puerperium: Secondary | ICD-10-CM | POA: Insufficient documentation

## 2018-04-30 DIAGNOSIS — Z880 Allergy status to penicillin: Secondary | ICD-10-CM

## 2018-04-30 HISTORY — DX: Other specified health status: Z78.9

## 2018-04-30 LAB — URINALYSIS, ROUTINE W REFLEX MICROSCOPIC
Bilirubin Urine: NEGATIVE
Glucose, UA: NEGATIVE mg/dL
Hgb urine dipstick: NEGATIVE
Ketones, ur: NEGATIVE mg/dL
Nitrite: NEGATIVE
Protein, ur: NEGATIVE mg/dL
SPECIFIC GRAVITY, URINE: 1.005 (ref 1.005–1.030)
pH: 7 (ref 5.0–8.0)

## 2018-04-30 LAB — COMPREHENSIVE METABOLIC PANEL
ALK PHOS: 170 U/L — AB (ref 38–126)
ALT: 22 U/L (ref 0–44)
AST: 24 U/L (ref 15–41)
Albumin: 2.5 g/dL — ABNORMAL LOW (ref 3.5–5.0)
Anion gap: 12 (ref 5–15)
BUN: 6 mg/dL (ref 6–20)
CALCIUM: 9.4 mg/dL (ref 8.9–10.3)
CO2: 20 mmol/L — ABNORMAL LOW (ref 22–32)
Chloride: 103 mmol/L (ref 98–111)
Creatinine, Ser: 0.67 mg/dL (ref 0.44–1.00)
GFR calc Af Amer: >60 mL/min (ref >60)
Glucose, Bld: 88 mg/dL (ref 70–99)
Potassium: 4.1 mmol/L (ref 3.5–5.1)
Sodium: 135 mmol/L (ref 135–145)
TOTAL PROTEIN: 6.8 g/dL (ref 6.5–8.1)
Total Bilirubin: 0.4 mg/dL (ref 0.3–1.2)

## 2018-04-30 LAB — CBC
HCT: 32.3 % — ABNORMAL LOW (ref 36.0–46.0)
Hemoglobin: 9.8 g/dL — ABNORMAL LOW (ref 12.0–15.0)
MCH: 23.6 pg — ABNORMAL LOW (ref 26.0–34.0)
MCHC: 30.3 g/dL (ref 30.0–36.0)
MCV: 77.6 fL — ABNORMAL LOW (ref 80.0–100.0)
Platelets: 212 10*3/uL (ref 150–400)
RBC: 4.16 MIL/uL (ref 3.87–5.11)
RDW: 20.6 % — ABNORMAL HIGH (ref 11.5–15.5)
WBC: 7.6 10*3/uL (ref 4.0–10.5)
nRBC: 0 % (ref 0.0–0.2)

## 2018-04-30 LAB — PROTEIN / CREATININE RATIO, URINE
Creatinine, Urine: 49.32 mg/dL
Protein Creatinine Ratio: 0.24 mg/mg{Cre} — ABNORMAL HIGH (ref 0.00–0.15)
Total Protein, Urine: 12 mg/dL

## 2018-04-30 NOTE — MAU Note (Signed)
Pt was told she had high BP yesterday in office. Told to come in if she started having headache or decreased fetal movement. Pt reports slight headache today and stated she had not felt baby move much today. On the way to hospital baby moving much more.

## 2018-04-30 NOTE — Discharge Instructions (Signed)

## 2018-04-30 NOTE — MAU Provider Note (Signed)
History     CSN: [id number]  Arrival date and time: 04/30/18 1630   First Provider Initiated Contact with Patient 04/30/18 1742      Chief Complaint  Patient presents with  . Headache  . Decreased Fetal Movement   HPI Tammy Mcdowell is a 33 y.o. G1P0 at [redacted]w[redacted]d who presents with a headache and decreased fetal movement. She states when she woke up her headache was a 6/10 and she took a bath with relief. She denies any pain at this time. She states the baby wasn't moving this morning either. She states the baby started moving on the way to the hospital but not as much as before. She denies any leaking or bleeding. Denies any abdominal pain. She denies any visual changes or epigastric pain.  She states her BP was elevated in the office on Friday. She states labs were done and were normal.  OB History    Gravida  1   Para      Term      Preterm      AB      Living        SAB      TAB      Ectopic      Multiple      Live Births              Past Medical History:  Diagnosis Date  . Medical history non-contributory     Past Surgical History:  Procedure Laterality Date  . BREAST SURGERY    . BREAST SURGERY  2010    History reviewed. No pertinent family history.  Social History   Tobacco Use  . Smoking status: Never Smoker  . Smokeless tobacco: Never Used  Substance Use Topics  . Alcohol use: Never    Frequency: Never  . Drug use: Never    Allergies:  Allergies  Allergen Reactions  . Penicillins Shortness Of Breath    Medications Prior to Admission  Medication Sig Dispense Refill Last Dose  . ferrous sulfate 325 (65 FE) MG EC tablet Take by mouth 3 (three) times daily with meals.     . Prenatal Vit-Fe Fumarate-FA (MULTIVITAMIN-PRENATAL) 27-0.8 MG TABS tablet Take 1 tablet by mouth daily at 12 noon.     Marland Kitchen albuterol (PROVENTIL HFA;VENTOLIN HFA) 108 (90 BASE) MCG/ACT inhaler Inhale 2 puffs into the lungs every 6 (six) hours as needed for  wheezing or shortness of breath. 1 Inhaler 3 More than a month at Unknown time  . beclomethasone (QVAR) 40 MCG/ACT inhaler Inhale 2 puffs into the lungs daily. To prevent asthma 1 Inhaler 12 More than a month at Unknown time  . FLUVIRIN SUSP ADM 0.5ML IM UTD  0   . hydrocortisone (PROCTOCORT) 1 % CREA Apply 2-4x a day as needed for hemorrhoids (Patient not taking: Reported on 07/16/2014) 1 Tube 1 Not Taking  . hydrocortisone (PROCTOCREAM-HC) 2.5 % rectal cream Place 1 application rectally 2 (two) times daily. (Patient not taking: Reported on 07/16/2014) 30 g 0 Not Taking  . montelukast (SINGULAIR) 10 MG tablet Take 1 tablet (10 mg total) by mouth at bedtime. To prevent asthma and allergies 30 tablet 11     Review of Systems  Constitutional: Negative.  Negative for fatigue and fever.  HENT: Negative.   Respiratory: Negative.  Negative for shortness of breath.   Cardiovascular: Negative.  Negative for chest pain.  Gastrointestinal: Negative.  Negative for abdominal pain, constipation, diarrhea, nausea and vomiting.  Genitourinary: Negative.  Negative for dysuria, vaginal bleeding and vaginal discharge.  Neurological: Positive for headaches. Negative for dizziness.   Physical Exam   Blood pressure 112/63, pulse 84, temperature 97.9 F (36.6 C), resp. rate 18.  Patient Vitals for the past 24 hrs:  BP Temp Pulse Resp  04/30/18 1915 112/63 - 84 -  04/30/18 1901 (!) 110/58 - 66 -  04/30/18 1831 126/83 - 82 -  04/30/18 1816 133/83 - 80 -  04/30/18 1801 125/84 - 77 -  04/30/18 1759 128/85 - 84 -  04/30/18 1731 135/87 97.9 F (36.6 C) 82 18  04/30/18 1724 137/87 - 84 -   Physical Exam  Nursing note and vitals reviewed. Constitutional: She is oriented to person, place, and time. She appears well-developed and well-nourished. No distress.  HENT:  Head: Normocephalic.  Eyes: Pupils are equal, round, and reactive to light.  Cardiovascular: Normal rate, regular rhythm and normal heart sounds.   Respiratory: Effort normal and breath sounds normal. No respiratory distress.  GI: Soft. Bowel sounds are normal. She exhibits no distension. There is no abdominal tenderness.  Neurological: She is alert and oriented to person, place, and time. She has normal reflexes. She displays normal reflexes. She exhibits normal muscle tone. Coordination normal.  Skin: Skin is warm and dry.  Psychiatric: She has a normal mood and affect. Her behavior is normal. Judgment and thought content normal.   Fetal Tracing:  Baseline: 140 Variability: moderate Accels: 15x15 Decels: none  Toco: occasional uc's  MAU Course  Procedures Results for orders placed or performed during the hospital encounter of 04/30/18 (from the past 24 hour(s))  CBC     Status: Abnormal   Collection Time: 04/30/18  5:42 PM  Result Value Ref Range   WBC 7.6 4.0 - 10.5 K/uL   RBC 4.16 3.87 - 5.11 MIL/uL   Hemoglobin 9.8 (L) 12.0 - 15.0 g/dL   HCT 24.4 (L) 97.5 - 30.0 %   MCV 77.6 (L) 80.0 - 100.0 fL   MCH 23.6 (L) 26.0 - 34.0 pg   MCHC 30.3 30.0 - 36.0 g/dL   RDW 51.1 (H) 02.1 - 11.7 %   Platelets 212 150 - 400 K/uL   nRBC 0.0 0.0 - 0.2 %  Comprehensive metabolic panel     Status: Abnormal   Collection Time: 04/30/18  5:42 PM  Result Value Ref Range   Sodium 135 135 - 145 mmol/L   Potassium 4.1 3.5 - 5.1 mmol/L   Chloride 103 98 - 111 mmol/L   CO2 20 (L) 22 - 32 mmol/L   Glucose, Bld 88 70 - 99 mg/dL   BUN 6 6 - 20 mg/dL   Creatinine, Ser 3.56 0.44 - 1.00 mg/dL   Calcium 9.4 8.9 - 70.1 mg/dL   Total Protein 6.8 6.5 - 8.1 g/dL   Albumin 2.5 (L) 3.5 - 5.0 g/dL   AST 24 15 - 41 U/L   ALT 22 0 - 44 U/L   Alkaline Phosphatase 170 (H) 38 - 126 U/L   Total Bilirubin 0.4 0.3 - 1.2 mg/dL   GFR calc non Af Amer >60 >60 mL/min   GFR calc Af Amer >60 >60 mL/min   Anion gap 12 5 - 15  Protein / creatinine ratio, urine     Status: Abnormal   Collection Time: 04/30/18  6:57 PM  Result Value Ref Range   Creatinine, Urine  49.32 mg/dL   Total Protein, Urine 12 mg/dL   Protein Creatinine Ratio 0.24 (H)  0.00 - 0.15 mg/mg[Cre]  Urinalysis, Routine w reflex microscopic     Status: Abnormal   Collection Time: 04/30/18  6:57 PM  Result Value Ref Range   Color, Urine YELLOW YELLOW   APPearance CLEAR CLEAR   Specific Gravity, Urine 1.005 1.005 - 1.030   pH 7.0 5.0 - 8.0   Glucose, UA NEGATIVE NEGATIVE mg/dL   Hgb urine dipstick NEGATIVE NEGATIVE   Bilirubin Urine NEGATIVE NEGATIVE   Ketones, ur NEGATIVE NEGATIVE mg/dL   Protein, ur NEGATIVE NEGATIVE mg/dL   Nitrite NEGATIVE NEGATIVE   Leukocytes,Ua LARGE (A) NEGATIVE   RBC / HPF 0-5 0 - 5 RBC/hpf   WBC, UA 0-5 0 - 5 WBC/hpf   Bacteria, UA MANY (A) NONE SEEN   Squamous Epithelial / LPF 0-5 0 - 5   MDM Prenatal records from private office reviewed.  Labs ordered and reviewed.  UA CBC, CMP, Protein/creat ratio NST reactive but patient still not reporting normal movement [place] MFM BPP- 8/8, normal AFI  Patient no longer has a headache since arrival to MAU Labs stable. No signs or symptoms of preeclampsia at this time. No elevated blood pressures while in MAU  Assessment and Plan   1. Decreased fetal movements in third trimester, single or unspecified fetus   2. [redacted] weeks gestation of pregnancy    -Discharge home in stable condition -Preeclampsia precautions discussed -Patient advised to follow-up with [facility] as scheduled on Monday -Patient may return to MAU as needed or if her condition were to change or worsen  Rolm Bookbinder CNM 04/30/2018, 8:17 PM

## 2018-05-02 ENCOUNTER — Inpatient Hospital Stay (EMERGENCY_DEPARTMENT_HOSPITAL)
Admission: AD | Admit: 2018-05-02 | Discharge: 2018-05-02 | Disposition: A | Discharge status: Home / Self Care | Payer: 59 | Source: Home / Self Care | Attending: Obstetrics & Gynecology | Admitting: Obstetrics & Gynecology

## 2018-05-02 ENCOUNTER — Encounter (HOSPITAL_COMMUNITY): Payer: Self-pay | Admitting: *Deleted

## 2018-05-02 ENCOUNTER — Other Ambulatory Visit: Payer: Self-pay

## 2018-05-02 DIAGNOSIS — O26893 Other specified pregnancy related conditions, third trimester: Secondary | ICD-10-CM | POA: Insufficient documentation

## 2018-05-02 DIAGNOSIS — Z3A38 38 weeks gestation of pregnancy: Secondary | ICD-10-CM

## 2018-05-02 DIAGNOSIS — Z3689 Encounter for other specified antenatal screening: Secondary | ICD-10-CM

## 2018-05-02 DIAGNOSIS — Z79899 Other long term (current) drug therapy: Secondary | ICD-10-CM | POA: Insufficient documentation

## 2018-05-02 DIAGNOSIS — O163 Unspecified maternal hypertension, third trimester: Secondary | ICD-10-CM | POA: Insufficient documentation

## 2018-05-02 DIAGNOSIS — R51 Headache: Secondary | ICD-10-CM | POA: Insufficient documentation

## 2018-05-02 LAB — COMPREHENSIVE METABOLIC PANEL
ALK PHOS: 162 U/L — AB (ref 38–126)
ALT: 30 U/L (ref 0–44)
AST: 37 U/L (ref 15–41)
Albumin: 2.5 g/dL — ABNORMAL LOW (ref 3.5–5.0)
Anion gap: 10 (ref 5–15)
BILIRUBIN TOTAL: 0.4 mg/dL (ref 0.3–1.2)
BUN: 7 mg/dL (ref 6–20)
CO2: 19 mmol/L — ABNORMAL LOW (ref 22–32)
Calcium: 8.9 mg/dL (ref 8.9–10.3)
Chloride: 102 mmol/L (ref 98–111)
Creatinine, Ser: 0.72 mg/dL (ref 0.44–1.00)
GFR calc Af Amer: >60 mL/min (ref >60)
GFR calc non Af Amer: >60 mL/min (ref >60)
Glucose, Bld: 85 mg/dL (ref 70–99)
POTASSIUM: 4.3 mmol/L (ref 3.5–5.1)
Sodium: 131 mmol/L — ABNORMAL LOW (ref 135–145)
Total Protein: 6.8 g/dL (ref 6.5–8.1)

## 2018-05-02 LAB — CBC
HEMATOCRIT: 32.5 % — AB (ref 36.0–46.0)
HEMOGLOBIN: 10 g/dL — AB (ref 12.0–15.0)
MCH: 23.9 pg — ABNORMAL LOW (ref 26.0–34.0)
MCHC: 30.8 g/dL (ref 30.0–36.0)
MCV: 77.8 fL — ABNORMAL LOW (ref 80.0–100.0)
Platelets: 203 10*3/uL (ref 150–400)
RBC: 4.18 MIL/uL (ref 3.87–5.11)
RDW: 20.4 % — ABNORMAL HIGH (ref 11.5–15.5)
WBC: 8.3 10*3/uL (ref 4.0–10.5)
nRBC: 0 % (ref 0.0–0.2)

## 2018-05-02 LAB — PROTEIN / CREATININE RATIO, URINE
Creatinine, Urine: 51.72 mg/dL
Protein Creatinine Ratio: 0.17 mg/mg{creat} — ABNORMAL HIGH (ref 0.00–0.15)
Total Protein, Urine: 9 mg/dL

## 2018-05-02 LAB — URINALYSIS, ROUTINE W REFLEX MICROSCOPIC
Bilirubin Urine: NEGATIVE
Glucose, UA: NEGATIVE mg/dL
Hgb urine dipstick: NEGATIVE
Ketones, ur: NEGATIVE mg/dL
Nitrite: NEGATIVE
Protein, ur: NEGATIVE mg/dL
Specific Gravity, Urine: 1.005 (ref 1.005–1.030)
pH: 7 (ref 5.0–8.0)

## 2018-05-02 MED ORDER — ACETAMINOPHEN 500 MG PO TABS
1000.0000 mg | ORAL_TABLET | Freq: Once | ORAL | Status: DC
  Filled 2018-05-02: qty 2

## 2018-05-02 NOTE — MAU Provider Note (Addendum)
History     CSN: [id number]  Arrival date and time: 05/02/18 1837  Chief Complaint  Patient presents with  . Hypertension   HPI  Tammy Mcdowell is 33 y.o. G50P0020 female at [redacted]w[redacted]d presenting for HTN. Reports she had elevated BPs at home this morning (systolics of 142, 135 and 170). She called her OB and was told to go to MAU for evaluation. Per patient, she has had protein in her urine at the office and elevated BPs of 140s systolic while in provider's office on 3/20. Endorses headache with vision changes this AM. Vision changes have resolved. Headache is present but very mild. She declines Tylenol.    OB History    Gravida  3   Para      Term      Preterm      AB  2   Living  0     SAB  1   TAB  1   Ectopic      Multiple      Live Births              Past Medical History:  Diagnosis Date  . Medical history non-contributory     Past Surgical History:  Procedure Laterality Date  . aboriton    . BREAST SURGERY    . BREAST SURGERY  2010    History reviewed. No pertinent family history.  Social History   Tobacco Use  . Smoking status: Never Smoker  . Smokeless tobacco: Never Used  Substance Use Topics  . Alcohol use: Never    Frequency: Never  . Drug use: Never    Allergies:  Allergies  Allergen Reactions  . Penicillins Shortness Of Breath    Medications Prior to Admission  Medication Sig Dispense Refill Last Dose  . ferrous sulfate 325 (65 FE) MG EC tablet Take by mouth 3 (three) times daily with meals.   05/02/2018 at Unknown time  . Prenatal Vit-Fe Fumarate-FA (MULTIVITAMIN-PRENATAL) 27-0.8 MG TABS tablet Take 1 tablet by mouth daily at 12 noon.   05/02/2018 at Unknown time  . albuterol (PROVENTIL HFA;VENTOLIN HFA) 108 (90 BASE) MCG/ACT inhaler Inhale 2 puffs into the lungs every 6 (six) hours as needed for wheezing or shortness of breath. 1 Inhaler 3 More than a month at Unknown time  . beclomethasone (QVAR) 40 MCG/ACT inhaler Inhale  2 puffs into the lungs daily. To prevent asthma 1 Inhaler 12 More than a month at Unknown time  . FLUVIRIN SUSP ADM 0.5ML IM UTD  0   . hydrocortisone (PROCTOCORT) 1 % CREA Apply 2-4x a day as needed for hemorrhoids (Patient not taking: Reported on 07/16/2014) 1 Tube 1 More than a month at Unknown time  . hydrocortisone (PROCTOCREAM-HC) 2.5 % rectal cream Place 1 application rectally 2 (two) times daily. (Patient not taking: Reported on 07/16/2014) 30 g 0 More than a month at Unknown time  . montelukast (SINGULAIR) 10 MG tablet Take 1 tablet (10 mg total) by mouth at bedtime. To prevent asthma and allergies 30 tablet 11 More than a month at Unknown time    Review of Systems  Constitutional: Negative for chills and fever.  Eyes: Negative for visual disturbance.  Respiratory: Negative for shortness of breath.   Cardiovascular: Negative for chest pain.  Gastrointestinal: Negative for abdominal pain, nausea and vomiting.  Musculoskeletal: Negative for back pain.  Neurological: Positive for headaches.   Physical Exam   Blood pressure 127/85, pulse 80, temperature 98.6 F (37 C), temperature  source Oral, resp. rate 18, height 5\' 3"  (1.6 m), weight 82.1 kg, SpO2 100 %.  Physical Exam  Constitutional: She is oriented to person, place, and time. She appears well-developed and well-nourished. No distress.  HENT:  Head: Normocephalic and atraumatic.  Eyes: Conjunctivae and EOM are normal.  Neck: Normal range of motion. Neck supple.  Cardiovascular: Normal rate, regular rhythm and normal heart sounds.  Respiratory: Effort normal and breath sounds normal. No respiratory distress.  GI: Soft. There is no abdominal tenderness. There is no rebound and no guarding.  Gravid  Musculoskeletal: Normal range of motion.        General: Edema present.  Neurological: She is alert and oriented to person, place, and time.  Skin: Skin is warm and dry. She is not diaphoretic.  Psychiatric: She has a normal mood and  affect. Her behavior is normal.    MAU Course  Procedures  MDM Will monitor BPs q15 min. CBC, CMET and UPC ordered.   CBC unremarkable. UPC 0.17, not indicative of pre-eclampsia.   Care transferred to Steward Drone, CNM.   De Hollingshead 05/02/2018, 8:55 PM   Lab results reviewed:  Results for orders placed or performed during the hospital encounter of 05/02/18 (from the past 24 hour(s))  Urinalysis, Routine w reflex microscopic     Status: Abnormal   Collection Time: 05/02/18  7:36 PM  Result Value Ref Range   Color, Urine YELLOW YELLOW   APPearance HAZY (A) CLEAR   Specific Gravity, Urine 1.005 1.005 - 1.030   pH 7.0 5.0 - 8.0   Glucose, UA NEGATIVE NEGATIVE mg/dL   Hgb urine dipstick NEGATIVE NEGATIVE   Bilirubin Urine NEGATIVE NEGATIVE   Ketones, ur NEGATIVE NEGATIVE mg/dL   Protein, ur NEGATIVE NEGATIVE mg/dL   Nitrite NEGATIVE NEGATIVE   Leukocytes,Ua LARGE (A) NEGATIVE   RBC / HPF 0-5 0 - 5 RBC/hpf   WBC, UA 21-50 0 - 5 WBC/hpf   Bacteria, UA MANY (A) NONE SEEN   Squamous Epithelial / LPF 6-10 0 - 5  Culture, OB Urine     Status: None (Preliminary result)   Collection Time: 05/02/18  7:36 PM  Result Value Ref Range   Specimen Description OB CLEAN CATCH    Special Requests      ADDED TO STERILE CONTAINER Performed at [facility], [address] [place], [place] [address]    Culture PENDING    Report Status PENDING   Protein / creatinine ratio, urine     Status: Abnormal   Collection Time: 05/02/18  8:12 PM  Result Value Ref Range   Creatinine, Urine 51.72 mg/dL   Total Protein, Urine 9 mg/dL   Protein Creatinine Ratio 0.17 (H) 0.00 - 0.15 mg/mg[Cre]  Comprehensive metabolic panel     Status: Abnormal   Collection Time: 05/02/18  8:21 PM  Result Value Ref Range   Sodium 131 (L) 135 - 145 mmol/L   Potassium 4.3 3.5 - 5.1 mmol/L   Chloride 102 98 - 111 mmol/L   CO2 19 (L) 22 - 32 mmol/L   Glucose, Bld 85 70 - 99 mg/dL   BUN 7 6 - 20 mg/dL    Creatinine, Ser 2.11 0.44 - 1.00 mg/dL   Calcium 8.9 8.9 - 94.1 mg/dL   Total Protein 6.8 6.5 - 8.1 g/dL   Albumin 2.5 (L) 3.5 - 5.0 g/dL   AST 37 15 - 41 U/L   ALT 30 0 - 44 U/L  Alkaline Phosphatase 162 (H) 38 - 126 U/L   Total Bilirubin 0.4 0.3 - 1.2 mg/dL   GFR calc non Af Amer >60 >60 mL/min   GFR calc Af Amer >60 >60 mL/min   Anion gap 10 5 - 15  CBC     Status: Abnormal   Collection Time: 05/02/18  8:21 PM  Result Value Ref Range   WBC 8.3 4.0 - 10.5 K/uL   RBC 4.18 3.87 - 5.11 MIL/uL   Hemoglobin 10.0 (L) 12.0 - 15.0 g/dL   HCT 16.132.5 (L) 09.636.0 - 04.546.0 %   MCV 77.8 (L) 80.0 - 100.0 fL   MCH 23.9 (L) 26.0 - 34.0 pg   MCHC 30.8 30.0 - 36.0 g/dL   RDW 40.920.4 (H) 81.111.5 - 91.415.5 %   Platelets 203 150 - 400 K/uL   nRBC 0.0 0.0 - 0.2 %   Consult with Dr Earlene Plateravis on assessment and evaluation. Recommends discharge with strict PEC precautions. Follow up in the office.   PCR decreased from 0.24 on 3/21 to 0.17 today  Prenatal records from office reviewed and no abnormal BP noted in office. Patient reports taking BP on automatic wrist BP cuff.   On arrival into patient room, husband on phone with [facility] on call nurse demanding that Mody see patient and induce her. Educated and discussed lab results with patient, negative for preeclampsia and headache is not concerning for preeclampsia- patient reports HA is resolving on own and declines medication, rates HA 1/10. Offered tylenol for patient, patient declined.   Discussed PEC precautions with patient and reasons to return to MAU. Patient normotensive in MAU today- no elevated BPs. Encouraged to call office in morning and set up BP check in office tomorrow, patient verbalizes understanding. Message left on after hours voicemail stating plan of care.   Pt stable at time of discharge. NST reactive and reassuring.   FHR: 140/ moderate/ +accels/ no decelerations  Toco: no UC   Assessment and Plan   1. [redacted] weeks gestation of pregnancy   2.  NST (non-stress test) reactive    BP normotensive in MAU Discharge home Follow up in office tomorrow for BP check  Strict PEC precautions and fetal kick counts Return to MAU as needed  Follow-up Information    [facility], [facility]. Go on 05/03/2018.   Why:  Make appointment to be seen tomorrow morning for blood pressure check in the office Contact information: [address] [place] [address] [phone]          Sharyon CableVeronica C Haleema Vanderheyden, Ina HomesCNM 05/02/18, 10:23 PM

## 2018-05-02 NOTE — MAU Note (Signed)
Received call back from L&D, they had not received notification for adm/ind.

## 2018-05-02 NOTE — MAU Note (Signed)
Was here on Sat for decreased FM, wasn't feeling.  Was seen on Friday in office, BP was high, lab work was done..  BP was high this morning at home. [phone]). Called office, was told with elevated BP, protein in urine- that she had pre-eclampsia and was for adm.  Denies HA, visual changes or epigastric pain, has a little swelling in feet.

## 2018-05-03 ENCOUNTER — Inpatient Hospital Stay (HOSPITAL_COMMUNITY): Payer: 59 | Admitting: Anesthesiology

## 2018-05-03 ENCOUNTER — Inpatient Hospital Stay (HOSPITAL_COMMUNITY)
Admission: AC | Admit: 2018-05-03 | Discharge: 2018-05-07 | DRG: 787 | Disposition: A | Discharge status: Home / Self Care | Payer: 59 | Attending: Obstetrics and Gynecology | Admitting: Obstetrics and Gynecology

## 2018-05-03 ENCOUNTER — Encounter (HOSPITAL_COMMUNITY)
Admission: AC | Disposition: A | Discharge status: Home / Self Care | Payer: Self-pay | Source: Home / Self Care | Attending: Obstetrics and Gynecology

## 2018-05-03 ENCOUNTER — Other Ambulatory Visit: Payer: Self-pay

## 2018-05-03 ENCOUNTER — Encounter (HOSPITAL_COMMUNITY): Payer: Self-pay | Admitting: *Deleted

## 2018-05-03 DIAGNOSIS — Z3689 Encounter for other specified antenatal screening: Secondary | ICD-10-CM | POA: Diagnosis not present

## 2018-05-03 DIAGNOSIS — O134 Gestational [pregnancy-induced] hypertension without significant proteinuria, complicating childbirth: Principal | ICD-10-CM | POA: Diagnosis present

## 2018-05-03 DIAGNOSIS — Z3A38 38 weeks gestation of pregnancy: Secondary | ICD-10-CM

## 2018-05-03 DIAGNOSIS — Z98891 History of uterine scar from previous surgery: Secondary | ICD-10-CM

## 2018-05-03 DIAGNOSIS — D62 Acute posthemorrhagic anemia: Secondary | ICD-10-CM | POA: Diagnosis not present

## 2018-05-03 DIAGNOSIS — O9081 Anemia of the puerperium: Secondary | ICD-10-CM | POA: Diagnosis not present

## 2018-05-03 DIAGNOSIS — O26893 Other specified pregnancy related conditions, third trimester: Secondary | ICD-10-CM | POA: Diagnosis present

## 2018-05-03 DIAGNOSIS — O139 Gestational [pregnancy-induced] hypertension without significant proteinuria, unspecified trimester: Secondary | ICD-10-CM | POA: Diagnosis present

## 2018-05-03 LAB — COMPREHENSIVE METABOLIC PANEL
ALT: 34 U/L (ref 0–44)
AST: 39 U/L (ref 15–41)
Albumin: 2.4 g/dL — ABNORMAL LOW (ref 3.5–5.0)
Alkaline Phosphatase: 163 U/L — ABNORMAL HIGH (ref 38–126)
Anion gap: 7 (ref 5–15)
BILIRUBIN TOTAL: 0.3 mg/dL (ref 0.3–1.2)
BUN: <5 mg/dL — ABNORMAL LOW (ref 6–20)
CO2: 17 mmol/L — ABNORMAL LOW (ref 22–32)
Calcium: 8.6 mg/dL — ABNORMAL LOW (ref 8.9–10.3)
Chloride: 109 mmol/L (ref 98–111)
Creatinine, Ser: 0.75 mg/dL (ref 0.44–1.00)
GFR calc Af Amer: >60 mL/min (ref >60)
GFR calc non Af Amer: >60 mL/min (ref >60)
Glucose, Bld: 116 mg/dL — ABNORMAL HIGH (ref 70–99)
Potassium: 3.9 mmol/L (ref 3.5–5.1)
Sodium: 133 mmol/L — ABNORMAL LOW (ref 135–145)
TOTAL PROTEIN: 6.7 g/dL (ref 6.5–8.1)

## 2018-05-03 LAB — CBC
HCT: 33.5 % — ABNORMAL LOW (ref 36.0–46.0)
Hemoglobin: 10.3 g/dL — ABNORMAL LOW (ref 12.0–15.0)
MCH: 24.2 pg — ABNORMAL LOW (ref 26.0–34.0)
MCHC: 30.7 g/dL (ref 30.0–36.0)
MCV: 78.6 fL — ABNORMAL LOW (ref 80.0–100.0)
Platelets: 198 10*3/uL (ref 150–400)
RBC: 4.26 MIL/uL (ref 3.87–5.11)
RDW: 20.5 % — AB (ref 11.5–15.5)
WBC: 7.8 10*3/uL (ref 4.0–10.5)
nRBC: 0 % (ref 0.0–0.2)

## 2018-05-03 LAB — TYPE AND SCREEN
ABO/RH(D): O POS
ANTIBODY SCREEN: NEGATIVE

## 2018-05-03 LAB — URIC ACID: Uric Acid, Serum: 7.3 mg/dL — ABNORMAL HIGH (ref 2.5–7.1)

## 2018-05-03 LAB — CULTURE, OB URINE

## 2018-05-03 LAB — OB RESULTS CONSOLE GBS: GBS: NEGATIVE

## 2018-05-03 LAB — POCT FERN TEST: POCT Fern Test: POSITIVE

## 2018-05-03 SURGERY — Surgical Case
Anesthesia: Epidural

## 2018-05-03 MED ORDER — ONDANSETRON HCL 4 MG/2ML IJ SOLN: 4.0000 mg | Freq: Once | INTRAMUSCULAR | Status: DC | PRN

## 2018-05-03 MED ORDER — FENTANYL-BUPIVACAINE-NACL 0.5-0.125-0.9 MG/250ML-% EP SOLN
12.0000 mL/h | EPIDURAL | Status: DC | PRN
  Filled 2018-05-03: qty 250

## 2018-05-03 MED ORDER — DIBUCAINE 1 % RE OINT: 1.0000 "application " | TOPICAL_OINTMENT | RECTAL | Status: DC | PRN

## 2018-05-03 MED ORDER — KETOROLAC TROMETHAMINE 30 MG/ML IJ SOLN
30.0000 mg | Freq: Four times a day (QID) | INTRAMUSCULAR | Status: AC
  Administered 2018-05-04: 30 mg via INTRAVENOUS
  Filled 2018-05-03: qty 1

## 2018-05-03 MED ORDER — PHENYLEPHRINE 40 MCG/ML (10ML) SYRINGE FOR IV PUSH (FOR BLOOD PRESSURE SUPPORT): 80.0000 ug | PREFILLED_SYRINGE | INTRAVENOUS | Status: DC | PRN

## 2018-05-03 MED ORDER — ONDANSETRON HCL 4 MG/2ML IJ SOLN: 4.0000 mg | Freq: Four times a day (QID) | INTRAMUSCULAR | Status: DC | PRN

## 2018-05-03 MED ORDER — LACTATED RINGERS IV SOLN
500.0000 mL | Freq: Once | INTRAVENOUS | Status: AC
  Administered 2018-05-03: 500 mL via INTRAVENOUS

## 2018-05-03 MED ORDER — FENTANYL-BUPIVACAINE-NACL 0.5-0.125-0.9 MG/250ML-% EP SOLN: 12.0000 mL/h | EPIDURAL | Status: DC | PRN

## 2018-05-03 MED ORDER — EPHEDRINE 5 MG/ML INJ: 10.0000 mg | INTRAVENOUS | Status: DC | PRN

## 2018-05-03 MED ORDER — OXYCODONE-ACETAMINOPHEN 5-325 MG PO TABS: 2.0000 | ORAL_TABLET | ORAL | Status: DC | PRN

## 2018-05-03 MED ORDER — NALOXONE HCL 0.4 MG/ML IJ SOLN: 0.4000 mg | INTRAMUSCULAR | Status: DC | PRN

## 2018-05-03 MED ORDER — OXYTOCIN BOLUS FROM INFUSION: 500.0000 mL | Freq: Once | INTRAVENOUS | Status: DC

## 2018-05-03 MED ORDER — LACTATED RINGERS IV SOLN
INTRAVENOUS | Status: DC | PRN
  Administered 2018-05-03 (×2): via INTRAVENOUS

## 2018-05-03 MED ORDER — SODIUM CHLORIDE 0.9 % IV SOLN
INTRAVENOUS | Status: DC | PRN
  Administered 2018-05-03: 500 mg via INTRAVENOUS

## 2018-05-03 MED ORDER — DIPHENHYDRAMINE HCL 50 MG/ML IJ SOLN: 12.5000 mg | INTRAMUSCULAR | Status: DC | PRN

## 2018-05-03 MED ORDER — FENTANYL CITRATE (PF) 100 MCG/2ML IJ SOLN
INTRAMUSCULAR | Status: AC
  Filled 2018-05-03: qty 2

## 2018-05-03 MED ORDER — LACTATED RINGERS IV SOLN: 500.0000 mL | INTRAVENOUS | Status: DC | PRN

## 2018-05-03 MED ORDER — SOD CITRATE-CITRIC ACID 500-334 MG/5ML PO SOLN
30.0000 mL | ORAL | Status: DC | PRN
  Administered 2018-05-03: 30 mL via ORAL
  Filled 2018-05-03: qty 15

## 2018-05-03 MED ORDER — OXYCODONE HCL 5 MG/5ML PO SOLN: 5.0000 mg | Freq: Once | ORAL | Status: DC | PRN

## 2018-05-03 MED ORDER — OXYTOCIN 40 UNITS IN NORMAL SALINE INFUSION - SIMPLE MED
2.5000 [IU]/h | INTRAVENOUS | Status: AC
  Administered 2018-05-04: 2.5 [IU]/h via INTRAVENOUS

## 2018-05-03 MED ORDER — NALBUPHINE HCL 10 MG/ML IJ SOLN: 5.0000 mg | Freq: Once | INTRAMUSCULAR | Status: DC | PRN

## 2018-05-03 MED ORDER — NALBUPHINE HCL 10 MG/ML IJ SOLN: 5.0000 mg | INTRAMUSCULAR | Status: DC | PRN

## 2018-05-03 MED ORDER — ACETAMINOPHEN 325 MG PO TABS
650.0000 mg | ORAL_TABLET | ORAL | Status: DC | PRN
  Administered 2018-05-03: 650 mg via ORAL
  Filled 2018-05-03: qty 2

## 2018-05-03 MED ORDER — ONDANSETRON HCL 4 MG/2ML IJ SOLN: 4.0000 mg | Freq: Three times a day (TID) | INTRAMUSCULAR | Status: DC | PRN

## 2018-05-03 MED ORDER — BUTORPHANOL TARTRATE 1 MG/ML IJ SOLN: 2.0000 mg | INTRAMUSCULAR | Status: DC | PRN

## 2018-05-03 MED ORDER — OXYCODONE-ACETAMINOPHEN 5-325 MG PO TABS: 1.0000 | ORAL_TABLET | ORAL | Status: DC | PRN

## 2018-05-03 MED ORDER — IBUPROFEN 800 MG PO TABS
800.0000 mg | ORAL_TABLET | Freq: Four times a day (QID) | ORAL | Status: DC
  Administered 2018-05-04 – 2018-05-07 (×12): 800 mg via ORAL
  Filled 2018-05-03 (×12): qty 1

## 2018-05-03 MED ORDER — DIPHENHYDRAMINE HCL 25 MG PO CAPS: 25.0000 mg | ORAL_CAPSULE | Freq: Four times a day (QID) | ORAL | Status: DC | PRN

## 2018-05-03 MED ORDER — LACTATED RINGERS IV SOLN
INTRAVENOUS | Status: DC
  Administered 2018-05-04: 10:00:00 via INTRAVENOUS

## 2018-05-03 MED ORDER — OXYCODONE-ACETAMINOPHEN 5-325 MG PO TABS
1.0000 | ORAL_TABLET | ORAL | Status: DC | PRN
  Administered 2018-05-04 – 2018-05-07 (×7): 1 via ORAL
  Filled 2018-05-03 (×8): qty 1

## 2018-05-03 MED ORDER — MEPERIDINE HCL 25 MG/ML IJ SOLN: 6.2500 mg | INTRAMUSCULAR | Status: DC | PRN

## 2018-05-03 MED ORDER — TETANUS-DIPHTH-ACELL PERTUSSIS 5-2.5-18.5 LF-MCG/0.5 IM SUSP: 0.5000 mL | Freq: Once | INTRAMUSCULAR | Status: DC

## 2018-05-03 MED ORDER — SODIUM CHLORIDE (PF) 0.9 % IJ SOLN
INTRAMUSCULAR | Status: DC | PRN
  Administered 2018-05-03: 12 mL/h via EPIDURAL

## 2018-05-03 MED ORDER — CLINDAMYCIN PHOSPHATE 900 MG/50ML IV SOLN
INTRAVENOUS | Status: DC | PRN
  Administered 2018-05-03: 900 mg via INTRAVENOUS

## 2018-05-03 MED ORDER — MORPHINE SULFATE (PF) 0.5 MG/ML IJ SOLN
INTRAMUSCULAR | Status: AC
  Filled 2018-05-03: qty 10

## 2018-05-03 MED ORDER — LACTATED RINGERS IV SOLN: 500.0000 mL | Freq: Once | INTRAVENOUS | Status: DC

## 2018-05-03 MED ORDER — COCONUT OIL OIL
1.0000 "application " | TOPICAL_OIL | Status: DC | PRN
  Administered 2018-05-06: 1 via TOPICAL

## 2018-05-03 MED ORDER — PRENATAL MULTIVITAMIN CH
1.0000 | ORAL_TABLET | Freq: Every day | ORAL | Status: DC
  Administered 2018-05-04 – 2018-05-07 (×4): 1 via ORAL
  Filled 2018-05-03 (×4): qty 1

## 2018-05-03 MED ORDER — SIMETHICONE 80 MG PO CHEW
80.0000 mg | CHEWABLE_TABLET | Freq: Three times a day (TID) | ORAL | Status: DC
  Administered 2018-05-04 – 2018-05-07 (×9): 80 mg via ORAL
  Filled 2018-05-03 (×9): qty 1

## 2018-05-03 MED ORDER — OXYCODONE HCL 5 MG PO TABS: 5.0000 mg | ORAL_TABLET | Freq: Once | ORAL | Status: DC | PRN

## 2018-05-03 MED ORDER — SCOPOLAMINE 1 MG/3DAYS TD PT72: 1.0000 | MEDICATED_PATCH | Freq: Once | TRANSDERMAL | Status: DC

## 2018-05-03 MED ORDER — OXYTOCIN 10 UNIT/ML IJ SOLN: 10.0000 [IU] | Freq: Once | INTRAMUSCULAR | Status: DC

## 2018-05-03 MED ORDER — SENNOSIDES-DOCUSATE SODIUM 8.6-50 MG PO TABS
2.0000 | ORAL_TABLET | ORAL | Status: DC
  Administered 2018-05-04 – 2018-05-06 (×4): 2 via ORAL
  Filled 2018-05-03 (×4): qty 2

## 2018-05-03 MED ORDER — OXYTOCIN 10 UNIT/ML IJ SOLN
INTRAVENOUS | Status: DC | PRN
  Administered 2018-05-03: 40 [IU] via INTRAVENOUS

## 2018-05-03 MED ORDER — SODIUM BICARBONATE 8.4 % IV SOLN
INTRAVENOUS | Status: DC | PRN
  Administered 2018-05-03: 2 mL via EPIDURAL
  Administered 2018-05-03: 10 mL via EPIDURAL

## 2018-05-03 MED ORDER — TERBUTALINE SULFATE 1 MG/ML IJ SOLN: 0.2500 mg | Freq: Once | INTRAMUSCULAR | Status: DC | PRN

## 2018-05-03 MED ORDER — LACTATED RINGERS IV SOLN
INTRAVENOUS | Status: DC | PRN
  Administered 2018-05-03: 22:00:00 via INTRAVENOUS

## 2018-05-03 MED ORDER — SODIUM CHLORIDE 0.9 % IV SOLN
INTRAVENOUS | Status: AC
  Filled 2018-05-03: qty 500

## 2018-05-03 MED ORDER — SODIUM CHLORIDE 0.9% FLUSH: 3.0000 mL | INTRAVENOUS | Status: DC | PRN

## 2018-05-03 MED ORDER — FENTANYL CITRATE (PF) 100 MCG/2ML IJ SOLN: 25.0000 ug | INTRAMUSCULAR | Status: DC | PRN

## 2018-05-03 MED ORDER — WITCH HAZEL-GLYCERIN EX PADS: 1.0000 "application " | MEDICATED_PAD | CUTANEOUS | Status: DC | PRN

## 2018-05-03 MED ORDER — ALBUTEROL SULFATE (2.5 MG/3ML) 0.083% IN NEBU: 3.0000 mL | INHALATION_SOLUTION | Freq: Four times a day (QID) | RESPIRATORY_TRACT | Status: DC | PRN

## 2018-05-03 MED ORDER — SIMETHICONE 80 MG PO CHEW
80.0000 mg | CHEWABLE_TABLET | ORAL | Status: DC | PRN
  Administered 2018-05-07: 80 mg via ORAL

## 2018-05-03 MED ORDER — LIDOCAINE HCL (PF) 1 % IJ SOLN: 30.0000 mL | INTRAMUSCULAR | Status: DC | PRN

## 2018-05-03 MED ORDER — DIPHENHYDRAMINE HCL 25 MG PO CAPS: 25.0000 mg | ORAL_CAPSULE | ORAL | Status: DC | PRN

## 2018-05-03 MED ORDER — MORPHINE SULFATE (PF) 0.5 MG/ML IJ SOLN
INTRAMUSCULAR | Status: DC | PRN
  Administered 2018-05-03: 2 mg via EPIDURAL

## 2018-05-03 MED ORDER — NALOXONE HCL 4 MG/10ML IJ SOLN
1.0000 ug/kg/h | INTRAVENOUS | Status: DC | PRN
  Filled 2018-05-03: qty 5

## 2018-05-03 MED ORDER — OXYTOCIN 40 UNITS IN NORMAL SALINE INFUSION - SIMPLE MED
1.0000 m[IU]/min | INTRAVENOUS | Status: DC
  Administered 2018-05-03: 2 m[IU]/min via INTRAVENOUS
  Filled 2018-05-03: qty 1000

## 2018-05-03 MED ORDER — MENTHOL 3 MG MT LOZG: 1.0000 | LOZENGE | OROMUCOSAL | Status: DC | PRN

## 2018-05-03 MED ORDER — LACTATED RINGERS IV SOLN
INTRAVENOUS | Status: DC
  Administered 2018-05-03 (×3): via INTRAVENOUS

## 2018-05-03 MED ORDER — DEXAMETHASONE SODIUM PHOSPHATE 10 MG/ML IJ SOLN
INTRAMUSCULAR | Status: DC | PRN
  Administered 2018-05-03: 10 mg via INTRAVENOUS

## 2018-05-03 MED ORDER — FENTANYL CITRATE (PF) 100 MCG/2ML IJ SOLN
INTRAMUSCULAR | Status: DC | PRN
  Administered 2018-05-03: 100 ug via EPIDURAL

## 2018-05-03 MED ORDER — CLINDAMYCIN PHOSPHATE 900 MG/50ML IV SOLN
INTRAVENOUS | Status: AC
  Filled 2018-05-03: qty 50

## 2018-05-03 MED ORDER — OXYTOCIN 40 UNITS IN NORMAL SALINE INFUSION - SIMPLE MED: 2.5000 [IU]/h | INTRAVENOUS | Status: DC

## 2018-05-03 MED ORDER — SIMETHICONE 80 MG PO CHEW
80.0000 mg | CHEWABLE_TABLET | ORAL | Status: DC
  Administered 2018-05-04 – 2018-05-06 (×3): 80 mg via ORAL
  Filled 2018-05-03 (×4): qty 1

## 2018-05-03 MED ORDER — METOCLOPRAMIDE HCL 5 MG/ML IJ SOLN
INTRAMUSCULAR | Status: DC | PRN
  Administered 2018-05-03: 5 mg via INTRAVENOUS

## 2018-05-03 MED ORDER — ONDANSETRON HCL 4 MG/2ML IJ SOLN
INTRAMUSCULAR | Status: DC | PRN
  Administered 2018-05-03: 4 mg via INTRAVENOUS

## 2018-05-03 SURGICAL SUPPLY — 36 items
BENZOIN TINCTURE PRP APPL 2/3 (GAUZE/BANDAGES/DRESSINGS) ×3 IMPLANT
CHLORAPREP W/TINT 26ML (MISCELLANEOUS) ×3 IMPLANT
CLAMP CORD UMBIL (MISCELLANEOUS) IMPLANT
CLOSURE WOUND 1/2 X4 (GAUZE/BANDAGES/DRESSINGS) ×1
CLOTH BEACON ORANGE TIMEOUT ST (SAFETY) ×3 IMPLANT
DRSG OPSITE POSTOP 4X10 (GAUZE/BANDAGES/DRESSINGS) ×3 IMPLANT
ELECT REM PT RETURN 9FT ADLT (ELECTROSURGICAL) ×3
ELECTRODE REM PT RTRN 9FT ADLT (ELECTROSURGICAL) ×1 IMPLANT
EXTRACTOR VACUUM KIWI (MISCELLANEOUS) ×3 IMPLANT
GLOVE BIOGEL PI IND STRL 6.5 (GLOVE) ×1 IMPLANT
GLOVE BIOGEL PI IND STRL 7.0 (GLOVE) ×2 IMPLANT
GLOVE BIOGEL PI INDICATOR 6.5 (GLOVE) ×2
GLOVE BIOGEL PI INDICATOR 7.0 (GLOVE) ×4
GLOVE ECLIPSE 6.5 STRL STRAW (GLOVE) ×3 IMPLANT
GOWN STRL REUS W/TWL LRG LVL3 (GOWN DISPOSABLE) ×6 IMPLANT
KIT ABG SYR 3ML LUER SLIP (SYRINGE) IMPLANT
NEEDLE HYPO 25X5/8 SAFETYGLIDE (NEEDLE) IMPLANT
NS IRRIG 1000ML POUR BTL (IV SOLUTION) ×3 IMPLANT
PACK C SECTION WH (CUSTOM PROCEDURE TRAY) ×3 IMPLANT
PAD OB MATERNITY 4.3X12.25 (PERSONAL CARE ITEMS) ×3 IMPLANT
PENCIL SMOKE EVAC W/HOLSTER (ELECTROSURGICAL) ×3 IMPLANT
RETRACTOR WND ALEXIS 25 LRG (MISCELLANEOUS) ×1 IMPLANT
RTRCTR WOUND ALEXIS 25CM LRG (MISCELLANEOUS) ×3
STRIP CLOSURE SKIN 1/2X4 (GAUZE/BANDAGES/DRESSINGS) ×2 IMPLANT
SUT MNCRL 0 VIOLET CTX 36 (SUTURE) ×2 IMPLANT
SUT MONOCRYL 0 CTX 36 (SUTURE) ×4
SUT PDS AB 0 CTX 36 PDP370T (SUTURE) IMPLANT
SUT PLAIN 2 0 XLH (SUTURE) IMPLANT
SUT VIC AB 0 CT1 36 (SUTURE) IMPLANT
SUT VIC AB 3-0 CT1 27 (SUTURE) ×4
SUT VIC AB 3-0 CT1 TAPERPNT 27 (SUTURE) ×2 IMPLANT
SUT VIC AB 4-0 KS 27 (SUTURE) ×3 IMPLANT
SUT VIC AB 4-0 PS2 27 (SUTURE) ×3 IMPLANT
TOWEL OR 17X24 6PK STRL BLUE (TOWEL DISPOSABLE) ×3 IMPLANT
TRAY FOLEY W/BAG SLVR 14FR LF (SET/KITS/TRAYS/PACK) IMPLANT
WATER STERILE IRR 1000ML POUR (IV SOLUTION) ×6 IMPLANT

## 2018-05-03 NOTE — MAU Note (Signed)
Had a contractions and a gush of fluid, clear- still coming. No bleeding.  Pains are maybe every 15-20 min.  Denies HA, visual changes or epigastric pain.

## 2018-05-03 NOTE — Op Note (Signed)
Cesarean Section Procedure Note   Nicolette Suite  05/03/2018  Indications: arrest of descent   Pre-operative Diagnosis: Primary unscheduled cesarean section; arrest of descent.   Post-operative Diagnosis: Same   Surgeon: Surgeon(s) and Role:    * Almquist, Candace Gallus, MD - Primary   Assistants: Arlan Organ, CNM  Anesthesia: epidural   Procedure Details:  The patient was seen in the labor Room. The risks, benefits, complications, treatment options, and expected outcomes were discussed with the patient. The patient concurred with the proposed plan, giving informed consent. identified as Tammy Mcdowell and the procedure verified as C-Section Delivery. A Time Out was held and the above information confirmed.  After induction of anesthesia, the patient was draped and prepped in the usual sterile manner, foley was draining urine well.  A pfannenstiel incision was made and carried down through the subcutaneous tissue to the fascia. Fascial incision was made and extended transversely. The fascia was separated from the underlying rectus tissue superiorly and inferiorly. The peritoneum was identified and entered. Peritoneal incision was extended longitudinally. Alexis-O retractor placed. The utero-vesical peritoneal reflection was incised transversely and the bladder flap was bluntly freed from the lower uterine segment. A low transverse uterine incision was made. Delivered from cephalic presentation was a female infant with vigorous cry. Apgar scores of 8 at one minute and 9 at five minutes. Delayed cord clamping done at 1 minute and baby handed to NICU team in attendance. Cord ph was not sent. Cord blood was obtained for evaluation. The placenta was removed spontaneously,  Intact and appeared normal. The uterine outline, tubes and ovaries appeared normal}. The uterine incision was closed with running locked sutures of . A second imbricating layer sutured.   Hemostasis was observed.  Alexis retractor removed. Peritoneal closure done with 2-0 Vicryl.  The fascia was then reapproximated with running sutures of 0Vicryl. The subcutaneous closure was performed using 3-0 vicryl. The skin was closed with 4-0Vicryl.   Instrument, sponge, and needle counts were correct prior the abdominal closure and were correct at the conclusion of the case.     Findings: viable female infant; normal uterus, tubes/ovaries bilaterally   Estimated Blood Loss:  Total IV Fluids:   Urine Output: 150CC OF clear urine  Specimens: @ORSPECIMEN @   Complications: no complications  Disposition: PACU - hemodynamically stable.   Maternal Condition: stable   Baby condition / location:  Couplet care / Skin to Skin  Attending Attestation: I performed the procedure.   Signed: Surgeon(s): Amado Nash Candace Gallus, MD

## 2018-05-03 NOTE — H&P (Signed)
Tammy Mcdowell is a 33 y.o. female presenting for admission with SROM @ 7 am  Clear fluid. (+) FM GBS cx neg OB History    Gravida  3   Para      Term      Preterm      AB  2   Living  0     SAB  1   TAB  1   Ectopic      Multiple      Live Births             Past Medical History:  Diagnosis Date  . Medical history non-contributory    Past Surgical History:  Procedure Laterality Date  . aboriton    . BREAST SURGERY    . BREAST SURGERY  2010   Family History: family history is not on file. Social History:  reports that she has never smoked. She has never used smokeless tobacco. She reports that she does not drink alcohol or use drugs.     Maternal Diabetes: No Genetic Screening: Normal Maternal Ultrasounds/Referrals: Normal Fetal Ultrasounds or other Referrals:  None Maternal Substance Abuse:  No Significant Maternal Medications:  None Significant Maternal Lab Results:  Lab values include: Group B Strep negative Other Comments:  elevated for BP. nl pih labs 3/23  Review of Systems  All other systems reviewed and are negative.  Maternal Medical History:  Reason for admission: Rupture of membranes.   Contractions: Frequency: rare.    Fetal activity: Perceived fetal activity is normal.    Prenatal complications: no prenatal complications Prenatal Complications - Diabetes: none.      Blood pressure (!) 142/84, pulse 92, temperature 98.8 F (37.1 C), resp. rate 18. Maternal Exam:  Abdomen: Patient reports no abdominal tenderness. Fetal presentation: vertex  Introitus: Amniotic fluid character: clear.  Pelvis: adequate for delivery.   Cervix: Cervix evaluated by digital exam.     Fetal Exam Fetal State Assessment: Category I - tracings are normal.     Physical Exam  Constitutional: She is oriented to person, place, and time. She appears well-developed and well-nourished.  HENT:  Head: Atraumatic.  Eyes: EOM are normal.  Neck:  Neck supple.  Cardiovascular: Regular rhythm.  Respiratory: Breath sounds normal.  GI: Soft.  Genitourinary:    Genitourinary Comments: Ve 2/70/-3   Neurological: She is alert and oriented to person, place, and time.  Skin: Skin is warm and dry.  Psychiatric: She has a normal mood and affect.    Prenatal labs: ABO, Rh:  O positive Antibody:  neg Rubella:  Immune RPR:   NR HBsAg:   neg HIV:   NR GBS:   neg  Assessment/Plan: SROM IUP@ 38 2/7 weeks P) admit PIH labs. Pitocin augmentation. Analgesic prn   Jary Louvier A Sausha Raymond 05/03/2018, 9:38 AM

## 2018-05-03 NOTE — Progress Notes (Signed)
Pt comfortable with epidural  Temp:  [98.1 F (36.7 C)-98.9 F (37.2 C)] 98.6 F (37 C) (03/24 1800) Pulse Rate:  [68-98] 96 (03/24 1800) Resp:  [16-20] 16 (03/24 1800) BP: (108-147)/(58-89) 147/87 (03/24 1800) SpO2:  [99 %-100 %] 100 % (03/24 1405) Weight:  [82 kg-82.1 kg] 82 kg (03/24 1025)  A&ox3 Normal respirations Abd: soft, nt, gravid; efw 7 1/2 Cx: c/c/-2; fetal head asynclitic with small ares of caput, suspect op; iupc placed; narrow arch  Fht: 150s, nml variability, +accels, occ early decels Toco: irreg, q 2-4 min  A/P: iupat 38.2 wga 1. Srom/active labor - now complete, will do position changes with peanut ball to encourage descent, will also increase pitocin as not adequate; contin plan for svd but guarded for delivery mode, pt and husband understand that if still high fetal station in 2 hrs would recommend c/s 2. Rh pos 3. gbs neg

## 2018-05-03 NOTE — Transfer of Care (Signed)
Immediate Anesthesia Transfer of Care Note  Patient: Tammy Mcdowell  Procedure(s) Performed: CESAREAN SECTION (N/A )  Patient Location: PACU  Anesthesia Type:Epidural  Level of Consciousness: awake, alert  and oriented  Airway & Oxygen Therapy: Patient Spontanous Breathing  Post-op Assessment: Report given to RN and Post -op Vital signs reviewed and stable  Post vital signs: Reviewed and stable  Last Vitals:  Vitals Value Taken Time  BP 124/80 05/03/2018 11:18 PM  Temp    Pulse 88 05/03/2018 11:19 PM  Resp 19 05/03/2018 11:19 PM  SpO2 98 % 05/03/2018 11:19 PM  Vitals shown include unvalidated device data.  Last Pain:  Vitals:   05/03/18 2000  TempSrc: Oral  PainSc:       Patients Stated Pain Goal: 0 (05/03/18 0905)  Complications: No apparent anesthesia complications

## 2018-05-03 NOTE — Progress Notes (Signed)
Patient comfortable  Temp:  [98.1 F (36.7 C)-100.1 F (37.8 C)] 100.1 F (37.8 C) (03/24 2000) Pulse Rate:  [68-98] 80 (03/24 2030) Resp:  [16-20] 16 (03/24 2030) BP: (108-147)/(58-89) 136/82 (03/24 2030) SpO2:  [99 %-100 %] 100 % (03/24 1405) Weight:  [82 kg] 82 kg (03/24 1025)  tmax 100.1  A&ox3 Abd: soft, nt, gravid Cx: c/c/-2, caput noted  FHT: 150s-160s, +accels, no decels, normal variability Toco: irregular and spacing out despite increasing pitocin  A/P: iup at 38.2 wga 1. Arrest of descent - I have reviewed no fetal descent despite increasing pitocin and contractions decreasing in frequency/strength, now 3 hrs sine complete; recommend to proceed for c/s delivery; reviewed risk of bleeding, infection, injury to bowel, bladder, nerves, blood vessels; risk of further surgery, risk of anesthesia, risk of blood clot to lungs/legs 2. Fetal tachycardia  3. Temp 100.1 - tylenol given

## 2018-05-03 NOTE — MAU Note (Addendum)
Pt reports water broke at 7am. Clear fluid out. C/o mild  contractions about q 10-15 min. Good fetal movement felt.

## 2018-05-03 NOTE — Anesthesia Procedure Notes (Signed)
Epidural Patient location during procedure: OB Start time: 05/03/2018 12:54 PM End time: 05/03/2018 1:12 PM  Staffing Anesthesiologist: Trevor Iha, MD Performed: anesthesiologist   Preanesthetic Checklist Completed: patient identified, site marked, surgical consent, pre-op evaluation, timeout performed, IV checked, risks and benefits discussed and monitors and equipment checked  Epidural Patient position: sitting Prep: site prepped and draped and DuraPrep Patient monitoring: continuous pulse ox and blood pressure Approach: midline Location: L3-L4 Injection technique: LOR air  Needle:  Needle type: Tuohy  Needle gauge: 17 G Needle length: 9 cm and 9 Needle insertion depth: 8 cm Catheter type: closed end flexible Catheter size: 19 Gauge Catheter at skin depth: 13 cm Test dose: negative  Assessment Events: blood not aspirated, injection not painful, no injection resistance, negative IV test and no paresthesia  Additional Notes Patient identified. Risks/Benefits/Options discussed with patient including but not limited to bleeding, infection, nerve damage, paralysis, failed block, incomplete pain control, headache, blood pressure changes, nausea, vomiting, reactions to medication both or allergic, itching and postpartum back pain. Confirmed with bedside nurse the patient's most recent platelet count. Confirmed with patient that they are not currently taking any anticoagulation, have any bleeding history or any family history of bleeding disorders. Patient expressed understanding and wished to proceed. All questions were answered. Sterile technique was used throughout the entire procedure. Please see nursing notes for vital signs. Test dose was given through epidural needle and negative prior to continuing to dose epidural or start infusion. Warning signs of high block given to the patient including shortness of breath, tingling/numbness in hands, complete motor block, or any  concerning symptoms with instructions to call for help. Patient was given instructions on fall risk and not to get out of bed. All questions and concerns addressed with instructions to call with any issues.  1 Attempt (S) . Patient tolerated procedure well.

## 2018-05-03 NOTE — Anesthesia Preprocedure Evaluation (Signed)
Anesthesia Evaluation  Patient identified by MRN, date of birth, ID band Patient awake    Reviewed: Allergy & Precautions, NPO status , Patient's Chart, lab work & pertinent test results  Airway Mallampati: II  TM Distance: >3 FB Neck ROM: Full    Dental no notable dental hx. (+) Teeth Intact   Pulmonary asthma ,    Pulmonary exam normal breath sounds clear to auscultation       Cardiovascular hypertension, Normal cardiovascular exam Rhythm:Regular Rate:Normal     Neuro/Psych negative neurological ROS     GI/Hepatic negative GI ROS,   Endo/Other    Renal/GU      Musculoskeletal   Abdominal   Peds  Hematology Hgb 10.3 Plts 198   Anesthesia Other Findings   Reproductive/Obstetrics (+) Pregnancy                             Anesthesia Physical Anesthesia Plan  ASA: III  Anesthesia Plan: Epidural   Post-op Pain Management:    Induction:   PONV Risk Score and Plan:   Airway Management Planned:   Additional Equipment:   Intra-op Plan:   Post-operative Plan:   Informed Consent: I have reviewed the patients History and Physical, chart, labs and discussed the procedure including the risks, benefits and alternatives for the proposed anesthesia with the patient or authorized representative who has indicated his/her understanding and acceptance.       Plan Discussed with:   Anesthesia Plan Comments: (Pt w PIH (Uric Acid 7.3) for LEA )        Anesthesia Quick Evaluation

## 2018-05-03 NOTE — Progress Notes (Signed)
Pt comfortable in bed States that she doesn't want pitocin - says she wants c/s - she is tired, shaking, and "in severe pain"  Temp:  [98.1 F (36.7 C)-98.9 F (37.2 C)] 98.6 F (37 C) (03/24 1800) Pulse Rate:  [68-98] 84 (03/24 1930) Resp:  [16-20] 18 (03/24 1930) BP: (108-147)/(58-89) 127/60 (03/24 1930) SpO2:  [99 %-100 %] 100 % (03/24 1405) Weight:  [82 kg] 82 kg (03/24 1025)  A&ox3; comfortable appearing, talking through contractions nml respirations Abd: soft, nt, gravid  Fht: cat 1 Toco: irreg  A/P: iup at 38.2 wga 1. Continue pitocin augmentation and reviewed importance for pitocin to help get adequate contractions; after many questions answered, patient agreed to increase pitocin 2. Fetal status reassuring

## 2018-05-04 LAB — ABO/RH: ABO/RH(D): O POS

## 2018-05-04 LAB — CBC
HCT: 30.1 % — ABNORMAL LOW (ref 36.0–46.0)
Hemoglobin: 9.6 g/dL — ABNORMAL LOW (ref 12.0–15.0)
MCH: 25 pg — AB (ref 26.0–34.0)
MCHC: 31.9 g/dL (ref 30.0–36.0)
MCV: 78.4 fL — ABNORMAL LOW (ref 80.0–100.0)
Platelets: 188 10*3/uL (ref 150–400)
RBC: 3.84 MIL/uL — ABNORMAL LOW (ref 3.87–5.11)
RDW: 20.6 % — ABNORMAL HIGH (ref 11.5–15.5)
WBC: 14.3 10*3/uL — ABNORMAL HIGH (ref 4.0–10.5)
nRBC: 0 % (ref 0.0–0.2)

## 2018-05-04 LAB — RPR: RPR Ser Ql: NONREACTIVE

## 2018-05-04 MED ORDER — POLYSACCHARIDE IRON COMPLEX 150 MG PO CAPS
150.0000 mg | ORAL_CAPSULE | Freq: Every day | ORAL | Status: DC
  Administered 2018-05-05 – 2018-05-07 (×3): 150 mg via ORAL
  Filled 2018-05-04 (×4): qty 1

## 2018-05-04 MED ORDER — MAGNESIUM OXIDE 400 (241.3 MG) MG PO TABS
400.0000 mg | ORAL_TABLET | Freq: Every day | ORAL | Status: DC
  Administered 2018-05-05 – 2018-05-07 (×3): 400 mg via ORAL
  Filled 2018-05-04 (×3): qty 1

## 2018-05-04 NOTE — Progress Notes (Signed)
POSTOPERATIVE DAY # 1 S/P [facility] for arrest of descent, baby girl    S:         Reports feeling okay, tired and sore has not slept much for several nights  Denies HA, visual changes, RUQ/epigastric pain              Tolerating po intake / no nausea / no vomiting / no flatus / no BM  Denies dizziness, SOB, or CP             Bleeding is moderate             Pain controlled with  Motrin             Up ad lib / ambulatory/ foley catheter out at 11:30am, no void yet  Newborn breast feeding - mom producing colostrum    O:  VS: BP 117/71 (BP Location: Left Arm)   Pulse 94   Temp 98.7 F (37.1 C) (Oral)   Resp 18   Ht 5\' 3"  (1.6 m)   Wt 82 kg   SpO2 100%   Breastfeeding Unknown   BMI 32.03 kg/m  05/04/18 1500  98 F (36.7 C)  66  -  18  135/69  Lying  100 %  -  - MH   05/04/18 1130  -  68  -  18  127/81  Standing  100 %  -  - JN   05/04/18 0930  -  94  -  18  117/71  Standing  100 %  -  - JN   05/04/18 0920  -  75  -  18  138/76  Sitting  100 %  -  - JN   05/04/18 0915  98.7 F (37.1 C)  68  -  18  122/75  Lying  100 %  -  - JN   05/04/18 0400  98.6 F (37 C)  62  -  18  132/64  Semi-fowlers  -  -  - EB   05/04/18 0300  98.2 F (36.8 C)  64  -  18  134/68  Semi-fowlers  99 %  -  - EB   05/04/18 0200  98.2 F (36.8 C)  61  -  18  137/78  Semi-fowlers  98 %  -  - EB   05/04/18 0100  100 F (37.8 C)  74  -  16  138/78  Semi-fowlers  99 %  -  - EB      LABS:               Recent Labs    05/03/18 0953 05/04/18 0521  WBC 7.8 14.3*  HGB 10.3* 9.6*  PLT 198 188               Bloodtype: --/--/O POS, O POS (03/24 0953)  Rubella: Immune (09/11 0000)                                             I&O: Intake/Output      03/24 0701 - 03/25 0700 03/25 0701 - 03/26 0700   I.V. (mL/kg) 1800 (22)    Total Intake(mL/kg) 1800 (22)    Urine (mL/kg/hr) 2475    Blood 502    Total Output 2977    Net -1177  Physical Exam:             Alert and Oriented  X3  Lungs: Clear and unlabored  Heart: regular rate and rhythm / no murmurs  Abdomen: soft, non-tender, (+) distended, hypoactive bowel sounds in all quadrants             Fundus: firm, non-tender, U-1             Dressing: honeycomb with steri-strips c/d/i              Incision:  approximated with sutures / no erythema / no ecchymosis / no drainage  Perineum: intact  Lochia: small, no clots  Extremities: +1 ankle and pedal edema, no calf pain or tenderness,  A/P:     POD # 1 S/P Primary LTCS            Gestational Hypertension    - BPs WNL   - No neural s/s  ABL Anemia compounding chronic IDA    - Plan to start oral FE tomorrow             Routine postoperative care             See lactation today  Warm liquids and ambulation to promote bowel motility   Okay to D/C IV after void   Continue current care  Carlean Jews, MSN, CNM [facility]

## 2018-05-04 NOTE — Lactation Note (Signed)
This note was copied from a baby's chart. Lactation Consultation Note Baby 7 hrs old. Not interested in BF. Will root, will not latch, will not suckle on gloved finger. Baby has high palate. 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 Baby clamped down, can feel hard edge of tooth under skin. Tooth is covered in skin. Raised high.  Mom has large breast, non-tender w/everted nipples. Mom has a scar to Rt. Breast beside of areola. Mom stated she had an abortion in 2010 and she got an abscess.  Hand expression taught, collecting 7 ml colostrum pouring from breast when expressed. Praised mom.  Attempted to give colostrum w/curve tip syring while suckling on gloved finger. Baby done 2 tight sucks them clamped. No more sucks.  Newborn feeding habits, breast massage, milk storage, STS, I&O, cluster feeding, supply and demand.  Mom is from culture that she wants everyone else to do and care for her baby. FOB at bedside and attentive.  Parents will need a lot of teaching as well as encouraging to be hands on themselves. Mom encouraged to waken baby for feeds if hasn't cued in 3 hrs.  Left Lactation brochure at Bed side.  Patient Name: Girl Raylie Bertini [id number] Date: 05/04/2018 Reason for consult: Initial assessment;1st time breastfeeding;Early term 37-38.6wks   Maternal Data Has patient been taught Hand Expression?: Yes Does the patient have breastfeeding experience prior to this delivery?: No  Feeding Feeding Type: Breast Fed  LATCH Score Latch: Too sleepy or reluctant, no latch achieved, no sucking elicited.  Audible Swallowing: None  Type of Nipple: Everted at rest and after stimulation  Comfort (Breast/Nipple): Soft / non-tender  Hold (Positioning): Full assist, staff holds infant at breast  LATCH Score: 4  Interventions Interventions: Breast feeding basics reviewed;Breast massage;Expressed milk;Hand express;Breast compression  Lactation Tools Discussed/Used Tools:  Pump Breast pump type: Manual WIC Program: No   Consult Status Consult Status: Follow-up Date: 05/04/18(in pm) Follow-up type: In-patient    Charyl Dancer 05/04/2018, 6:13 AM

## 2018-05-04 NOTE — Lactation Note (Addendum)
This note was copied from a baby's chart. Lactation Consultation Note  Patient Name: Tammy Mcdowell [id number] Date: 05/04/2018 Reason for consult: Follow-up assessment   P1, Baby 14 hours old and baby has been unable to sustain latch. Baby has natal tooth on lower gum. Williams MD directed that mother should start pumping with DEBP. Mother has been hand expressing since 34 weeks and has good flow of colostrum. Marijean Niemann RN states baby has not latched but mother was able to hand express approx 10 ml and recently was given to baby via curved tip syringe. Marijean Niemann RN attempted latching with mother.  Baby sleeping in crib.  Reviewed hand expression w/ mother. Set up DEBP and suggest until baby is latching to pump q 3 hours. Mother pumped approx 3 ml of colostrum and will give to baby via syringe w/ next cues. Recommend mother post pump q 3 hours for 10-20 min with DEBP on initiation setting. Give baby back volume pumped at the next feeding. Reviewed cleaning and milk storage.  Mother states she has DEBP at home. Mom made aware of O/P services, breastfeeding support groups, community resources, and our phone # for post-discharge questions.        Maternal Data Has patient been taught Hand Expression?: Yes Does the patient have breastfeeding experience prior to this delivery?: No  Feeding Feeding Type: Breast Fed  LATCH Score Latch: Repeated attempts needed to sustain latch, nipple held in mouth throughout feeding, stimulation needed to elicit sucking reflex.  Audible Swallowing: A few with stimulation  Type of Nipple: Everted at rest and after stimulation  Comfort (Breast/Nipple): Soft / non-tender  Hold (Positioning): Assistance needed to correctly position infant at breast and maintain latch.  LATCH Score: 7  Interventions Interventions: Hand express;DEBP  Lactation Tools Discussed/Used Tools: Pump Breast pump type: Double-Electric Breast Pump;Manual WIC Program:  No Pump Review: Setup, frequency, and cleaning;Milk Storage Initiated by:: Dahlia Byes RN IBCLC Date initiated:: 05/04/18   Consult Status Consult Status: Follow-up Date: 05/05/18 Follow-up type: In-patient    Dahlia Byes [facility] 05/04/2018, 12:33 PM

## 2018-05-05 DIAGNOSIS — O139 Gestational [pregnancy-induced] hypertension without significant proteinuria, unspecified trimester: Secondary | ICD-10-CM | POA: Diagnosis present

## 2018-05-05 DIAGNOSIS — D62 Acute posthemorrhagic anemia: Secondary | ICD-10-CM | POA: Diagnosis not present

## 2018-05-05 NOTE — Lactation Note (Signed)
This note was copied from a baby's chart. Lactation Consultation Note  Patient Name: Tammy Mcdowell [id number] Date: 05/05/2018  P1, 28 hour female infant. Difficulties with latching to breast. Parents unsure about how many voids and stools. LC changed one stool and one void diaper while in room. Per mom, Nurse helped with latch earlier and she did not like baby crying when LC was trying to assist mom in latching baby to breast. Infant did not latch at this time, taught back with visuals what a good latch looks like in Mother Baby booklet. Mom has not been pumping as previously advised by LC earlier mom pumped one time today. LC discussed that pumping every 3 hours for 15 minutes will help stimulate breast and help with milk induction.  Mom knows to give infant any EBM from pumping.  Per mom, I wanted baby to sleep she felt she was tired. Mom hand expressed small amount of colostrum on spoon 2 ml that was given to infant.  LC reinforced importance of infant latching to breast and feeding according hunger cues, 8 or more times within 24 hours.  Mom knows to call Nurse or LC if she needs assistance with latching infant to breast.   Maternal Data    Feeding Feeding Type: Breast Fed  LATCH Score Latch: Repeated attempts needed to sustain latch, nipple held in mouth throughout feeding, stimulation needed to elicit sucking reflex.  Audible Swallowing: None  Type of Nipple: Everted at rest and after stimulation  Comfort (Breast/Nipple): Soft / non-tender  Hold (Positioning): Full assist, staff holds infant at breast  LATCH Score: 5  Interventions Interventions: (mom says baby in pain reassured mom not pain baby hungry)  Lactation Tools Discussed/Used     Consult Status Consult Status: Follow-up Date: 05/05/18 Follow-up type: In-patient    Danelle Earthly 05/05/2018, 2:31 AM

## 2018-05-05 NOTE — Lactation Note (Signed)
This note was copied from a baby's chart. Lactation Consultation Note  Patient Name: Tammy Mcdowell [id number] Date: 05/05/2018 Reason for consult: Follow-up assessment   P1, Baby 40 hours old and on phototherapy. Mother attempting to latch in football hold. Mother is still working on postioning and needs lots of guidance. Baby is doing better today with sucking bursts when latched. During feeding, FOB offered to hand express from other breast and gave approx 3 ml to baby on spoon.  Cross cradle position latched worked better.  Adjusted pillows for support. Mother states she has pumped 2-3 times since yesterday and has a hard time getting her flow of colostrum into containers while pumping.   Reminded mother to sit upright and lean forward during pumping session. Encouraged her to pump q 2.5- 3 hours. Reviewed w/ FOB how to work pump. Feed on demand approximately 8-12 times per day.   Suggest parents wake baby for feedings if needed.    Maternal Data Has patient been taught Hand Expression?: Yes Does the patient have breastfeeding experience prior to this delivery?: No  Feeding Feeding Type: Breast Fed  LATCH Score Latch: Repeated attempts needed to sustain latch, nipple held in mouth throughout feeding, stimulation needed to elicit sucking reflex.  Audible Swallowing: A few with stimulation  Type of Nipple: Everted at rest and after stimulation  Comfort (Breast/Nipple): Soft / non-tender  Hold (Positioning): Assistance needed to correctly position infant at breast and maintain latch.  LATCH Score: 7  Interventions Interventions: Breast feeding basics reviewed;Assisted with latch;Skin to skin;Breast massage;Hand express;Support pillows;Position options;DEBP;Hand pump  Lactation Tools Discussed/Used Tools: Pump Breast pump type: Double-Electric Breast Pump;Manual   Consult Status Consult Status: Follow-up Date: 05/06/18 Follow-up type:  In-patient    Dahlia Byes [facility] 05/05/2018, 2:35 PM

## 2018-05-05 NOTE — Progress Notes (Signed)
POSTOPERATIVE DAY # 2 S/P CS - arrest of descent  S:        " tired " / partner sleeping and snoring at bedside             Pain controlled with Motrin and oxycodone             Up ad lib / not ambulatory - up to BR PRN/ voiding QS  Newborn Bottle and Breast   O:  VS: BP (!) 141/81   Pulse 67   Temp 98.4 F (36.9 C)   Resp 18   Ht 5\' 3"  (1.6 m)   Wt 82 kg   SpO2 100%   Breastfeeding Unknown   BMI 32.03 kg/m    BP: 141/81 - 117/71 -138/82 - 135/69   LABS:              Recent Labs    05/03/18 0953 05/04/18 0521  WBC 7.8 14.3*  HGB 10.3* 9.6*  PLT 198 188               Bloodtype: --/--/O POS, O POS (03/24 7741)  Rubella: Immune (09/11 0000)                                           I&O: Intake/Output      03/25 0701 - 03/26 0700 03/26 0701 - 03/27 0700   I.V. (mL/kg) 667.8 (8.1)    Total Intake(mL/kg) 667.8 (8.1)    Urine (mL/kg/hr) 1300 (0.7)    Blood     Total Output 1300    Net -632.2                    Physical Exam:             Lethargic awakened for visit - fell back asleep before exam completed  Lungs: Clear and unlabored  Heart: regular rate and rhythm / no mumurs  Abdomen: soft, non-tender, non-distended, active BS             Fundus: firm, non-tender, Uevne             Dressing intact without drainage        Extremities: no edema, no calf pain or tenderness  A:        POD # 2 S/P CS            Gestational hypertension - stable BP             ABL anemia  P:        Routine postoperative care              Anticipate DC tomorrow   Marlinda Mike CNM, MSN, [facility] 05/05/2018, 8:39 AM

## 2018-05-05 NOTE — Anesthesia Postprocedure Evaluation (Signed)
Anesthesia Post Note  Patient: Litzzy Wain  Procedure(s) Performed: CESAREAN SECTION (N/A )     Patient location during evaluation: PACU Anesthesia Type: Epidural Level of consciousness: oriented and awake and alert Pain management: pain level controlled Vital Signs Assessment: post-procedure vital signs reviewed and stable Respiratory status: spontaneous breathing, respiratory function stable and nonlabored ventilation Cardiovascular status: blood pressure returned to baseline and stable Postop Assessment: no headache, no backache, no apparent nausea or vomiting and epidural receding Anesthetic complications: no    Last Vitals:  Vitals:   05/04/18 2223 05/05/18 0530  BP: 117/71 (!) 141/81  Pulse: 73 67  Resp: 18 18  Temp: 36.9 C 36.9 C  SpO2: 98% 100%    Last Pain:  Vitals:   05/05/18 1405  TempSrc:   PainSc: 6                  Lucretia Kern

## 2018-05-06 ENCOUNTER — Encounter (HOSPITAL_COMMUNITY): Payer: Self-pay | Admitting: Obstetrics and Gynecology

## 2018-05-06 NOTE — Lactation Note (Signed)
This note was copied from a baby's chart. Lactation Consultation Note  Patient Name: Tammy Mcdowell [id number] Date: 05/06/2018 Reason for consult: Follow-up assessment;Hyperbilirubinemia;Infant weight loss;Early term 37-38.6wks Baby is 64 hours old/10% weight loss.  Bilirubin increased this morning to 15.8.  Mom had been attempting to latch and pumping. Mom was removing baby from lights to feed. She is giving small amount of colostrum (5-15 mls) every 3-4 hours.  Pediatrician instructed family to keep baby on lights and increase supplemental volume.  Baby recently took 10 mls of breast milk and 15 mls of formula.  Reviewed plan with family to pump and bottle feed until jaundice level improves.  Mom and FOB understand and agreeable with plan.  Instructed to pump every 2-3 hours giving baby expressed milk and additional formula for adequate volume.  Mom is exhausted and anxious for her milk to increase.  Baby is sleeping and mom encouraged to nap.  Maternal Data    Feeding Feeding Type: Bottle Fed - Breast Milk Nipple Type: Slow - flow  LATCH Score                   Interventions    Lactation Tools Discussed/Used     Consult Status Consult Status: Follow-up Date: 05/07/18 Follow-up type: In-patient    Huston Foley 05/06/2018, 2:25 PM

## 2018-05-06 NOTE — Progress Notes (Signed)
POSTOPERATIVE DAY # 3 S/P CS - arrest of descent  S:        Passed 2 small clots yesterday             Pain controlled with Motrin and oxycodone             Up ad lib / not ambulatory - up to BR PRN/ voiding QS  Newborn Bottle and Breast   O:  VS: BP (!) 142/83 (BP Location: Left Arm)   Pulse 67   Temp 98.2 F (36.8 C) (Oral)   Resp 18   Ht 5\' 3"  (1.6 m)   Wt 82 kg   SpO2 100%   Breastfeeding Unknown   BMI 32.03 kg/m    BP: 141/81 - 117/71 -138/82 - 135/69   LABS:              Recent Labs    05/04/18 0521  WBC 14.3*  HGB 9.6*  PLT 188               Bloodtype: --/--/O POS, O POS (03/24 8841)  Rubella: Immune (09/11 0000)                                           I&O: Intake/Output      03/26 0701 - 03/27 0700 03/27 0701 - 03/28 0700   I.V. (mL/kg)     Total Intake(mL/kg)     Urine (mL/kg/hr)     Total Output     Net                     Physical Exam:             Lethargic awakened for visit - fell back asleep before exam completed  Lungs: Clear and unlabored  Heart: regular rate and rhythm / no mumurs  Abdomen: soft, non-tender, non-distended, active BS             Fundus: firm, non-tender, Uevne             Dressing intact without drainage        Extremities: no edema, no calf pain or tenderness  A:        POD # 3 S/P CS            Gestational hypertension - stable BP             ABL anemia             Neonatal hyperbilirubinemia   P:        Routine postoperative care              Anticipate DC tomorrow   Lenoard Aden CNM, MSN, [facility] 05/06/2018, 10:54 AM

## 2018-05-07 DIAGNOSIS — Z98891 History of uterine scar from previous surgery: Secondary | ICD-10-CM

## 2018-05-07 MED ORDER — IBUPROFEN 800 MG PO TABS: 800.0000 mg | ORAL_TABLET | Freq: Four times a day (QID) | ORAL | 0 refills | Status: AC

## 2018-05-07 MED ORDER — SIMETHICONE 80 MG PO CHEW: 80.0000 mg | CHEWABLE_TABLET | ORAL | 0 refills | Status: AC | PRN

## 2018-05-07 MED ORDER — SENNOSIDES-DOCUSATE SODIUM 8.6-50 MG PO TABS: 2.0000 | ORAL_TABLET | ORAL | Status: AC

## 2018-05-07 MED ORDER — MAGNESIUM OXIDE 400 (241.3 MG) MG PO TABS: 400.0000 mg | ORAL_TABLET | Freq: Every day | ORAL | Status: AC

## 2018-05-07 MED ORDER — OXYCODONE-ACETAMINOPHEN 5-325 MG PO TABS: 1.0000 | ORAL_TABLET | ORAL | 0 refills | Status: AC | PRN

## 2018-05-07 MED ORDER — POLYSACCHARIDE IRON COMPLEX 150 MG PO CAPS: 150.0000 mg | ORAL_CAPSULE | Freq: Every day | ORAL | Status: AC

## 2018-05-07 MED ORDER — COCONUT OIL OIL: 1.0000 "application " | TOPICAL_OIL | 0 refills | Status: AC | PRN

## 2018-05-07 MED ORDER — ALBUTEROL SULFATE HFA 108 (90 BASE) MCG/ACT IN AERS: 2.0000 | INHALATION_SPRAY | Freq: Four times a day (QID) | RESPIRATORY_TRACT | 3 refills | Status: AC | PRN

## 2018-05-07 NOTE — Discharge Summary (Signed)
OB Discharge Summary  Patient Name: Tammy Mcdowell DOB: [birth year] MRN: [id number]  Date of admission: 05/03/2018 Delivering provider: Rhoderick Moody E   Date of discharge: 05/07/2018  Admitting diagnosis: 38WKS WATER BROKE Intrauterine pregnancy: [redacted]w[redacted]d     Secondary diagnosis:Principal Problem:   Postpartum care following cesarean delivery (3/24) Active Problems:   Acute blood loss anemia   Gestational hypertension   S/P emergency cesarean section - arrest of dilation  Additional problems:none     Discharge diagnosis:  Patient Active Problem List   Diagnosis Date Noted  . S/P emergency cesarean section - arrest of dilation 05/07/2018  . Acute blood loss anemia 05/05/2018  . Gestational hypertension 05/05/2018  . Postpartum care following cesarean delivery (3/24) 05/04/2018  . Extrinsic asthma 12/29/2014  . Hemorrhoid 12/29/2014  . Other seasonal allergic rhinitis 12/29/2014  . BMI 29.0-29.9,adult 12/29/2014                                                                Post partum procedures:none  Augmentation: Pitocin Pain control: Epidural  Laceration:None  Episiotomy:None  Complications: None   Hospital course:  Onset of Labor With Unplanned C/S  33 y.o. yo [id number] at [redacted]w[redacted]d was admitted in Active Labor on 05/03/2018. Patient had a labor course significant for early SROM. Membrane Rupture Time/Date: 7:00 AM ,05/03/2018   The patient went for cesarean section due to Arrest of Dilation, and delivered a Viable infant,05/03/2018  Details of operation can be found in separate operative note. Patient had an uncomplicated postpartum course.  She is ambulating,tolerating a regular diet, passing flatus, and urinating well.  Patient is discharged home in stable condition 05/07/18.  Physical exam  Vitals:   05/05/18 2327 05/06/18 0524 05/06/18 2150 05/07/18 0639  BP: (!) 133/93 (!) 142/83 127/88 131/82  Pulse: 79 67 95 77  Resp: 18  18 18   Temp: 99 F (37.2 C) 98.2 F  (36.8 C) 98.3 F (36.8 C) 98.2 F (36.8 C)  TempSrc: Oral Oral Oral Oral  SpO2:   100% 99%  Weight:      Height:       General: alert, cooperative and no distress Lochia: appropriate Uterine Fundus: firm Incision: Healing well with no significant drainage DVT Evaluation: No cords or calf tenderness. No significant calf/ankle edema. Labs: Lab Results  Component Value Date   WBC 14.3 (H) 05/04/2018   HGB 9.6 (L) 05/04/2018   HCT 30.1 (L) 05/04/2018   MCV 78.4 (L) 05/04/2018   PLT 188 05/04/2018   CMP Latest Ref Rng & Units 05/03/2018  Glucose 70 - 99 mg/dL 761(Y)  BUN 6 - 20 mg/dL <0(V)  Creatinine 3.71 - 1.00 mg/dL 0.62  Sodium 694 - 854 mmol/L 133(L)  Potassium 3.5 - 5.1 mmol/L 3.9  Chloride 98 - 111 mmol/L 109  CO2 22 - 32 mmol/L 17(L)  Calcium 8.9 - 10.3 mg/dL 6.2(V)  Total Protein 6.5 - 8.1 g/dL 6.7  Total Bilirubin 0.3 - 1.2 mg/dL 0.3  Alkaline Phos 38 - 126 U/L 163(H)  AST 15 - 41 U/L 39  ALT 0 - 44 U/L 34    Vaccines: TDaP UTD         Flu    UTD  Discharge instruction: per After Visit Summary and "Baby and Me Booklet".  After Visit Meds:  Allergies as of 05/07/2018      Reactions   Penicillins    Did it involve swelling of the face/tongue/throat, SOB, or low BP? no Did it involve sudden or severe rash/hives, skin peeling, or any reaction on the inside of your mouth or nose? No Did you need to seek medical attention at a hospital or doctor's office? Yes When did it last happen?15-20 years ago If all above answers are "NO", may proceed with cephalosporin use.      Medication List    STOP taking these medications   ferrous sulfate 325 (65 FE) MG EC tablet     TAKE these medications   albuterol 108 (90 Base) MCG/ACT inhaler Commonly known as:  PROVENTIL HFA;VENTOLIN HFA Inhale 2 puffs into the lungs every 6 (six) hours as needed for wheezing or shortness of breath.   coconut oil Oil Apply 1 application topically as needed.   ibuprofen 800 MG  tablet Commonly known as:  ADVIL,MOTRIN Take 1 tablet (800 mg total) by mouth every 6 (six) hours.   iron polysaccharides 150 MG capsule Commonly known as:  NIFEREX Take 1 capsule (150 mg total) by mouth daily.   magnesium oxide 400 (241.3 Mg) MG tablet Commonly known as:  MAG-OX Take 1 tablet (400 mg total) by mouth daily.   multivitamin-prenatal 27-0.8 MG Tabs tablet Take 1 tablet by mouth daily at 12 noon.   oxyCODONE-acetaminophen 5-325 MG tablet Commonly known as:  PERCOCET/ROXICET Take 1-2 tablets by mouth every 4 (four) hours as needed for moderate pain.   senna-docusate 8.6-50 MG tablet Commonly known as:  Senokot-S Take 2 tablets by mouth daily. Start taking on:  May 08, 2018   simethicone 80 MG chewable tablet Commonly known as:  MYLICON Chew 1 tablet (80 mg total) by mouth as needed for flatulence.       Diet: routine diet  Activity: Advance as tolerated. Pelvic rest for 6 weeks.   Postpartum contraception: Not Discussed  Newborn Data: Live born female  Birth Weight: 6 lb 12.8 oz (3085 g) APGAR: 8, 9  Newborn Delivery   Birth date/time:  05/03/2018 22:17:00 Delivery type:  C-Section, Low Vertical C-section categorization:  Primary     named Lenord Fellers Baby Feeding: Breast Disposition: remains inpatient for phototherapy   Delivery Report:  Review the Delivery Report for details.    Follow up: Follow-up Information    Shea Evans, MD Follow up in 6 week(s).   Specialty:  Obstetrics and Gynecology Contact information: [address] [place] [address] [phone]          Home health RN visit in 1 week for BP check   Signed: Neta Mends, CNM, MSN 05/07/2018, 11:22 AM

## 2018-05-07 NOTE — Lactation Note (Signed)
This note was copied from a baby's chart. Lactation Consultation Note  Patient Name: Tammy Mcdowell Today's Date: 05/07/2018   P1, Baby 83 hours old and on phototherapy.Mother is pumping and bottle feeding. Weight increased. Infant has natal tooth. Mother is now pumping 30 ml + and supplementing w/ formula. Encouraged mother to continue to pump q 2.5-3 hours. Family bottle feeding while infant is lying flat.  Suggest holding infant more upright in cradle hold while phototherapy lights on.  Reviewed feeding position w/ parents.  Noted infants very spitty during paced feeding. Suggest at next feeding parents try purple nfant nipple.       Maternal Data    Feeding    LATCH Score                   Interventions    Lactation Tools Discussed/Used     Consult Status      Dahlia Byes [facility] 05/07/2018, 10:02 AM

## 2018-05-07 NOTE — Progress Notes (Signed)
Patient ID: Tammy Mcdowell, female   DOB: 30-Mar-1985, 33 y.o.   MRN: [id number] Subjective: POD# 4 Live born female  Birth Weight: 6 lb 12.8 oz (3085 g) APGAR: 8, 9  Newborn Delivery   Birth date/time:  05/03/2018 22:17:00 Delivery type:  C-Section, Low Vertical C-section categorization:  Primary    Baby name: Lenord Fellers Continues on phototherapy, [place] +  Delivering provider: Rhoderick Moody E   Feeding: breast / pumping  Pain control at delivery: Epidural   Reports feeling well  Patient reports tolerating PO.   Breast symptoms:+ colostrum/milk Pain controlled with PO meds Denies HA/SOB/C/P/N/V/dizziness. Flatus present. She reports vaginal bleeding as normal, without clots.  She is ambulating, urinating without difficulty.     Objective:   VS:    Vitals:   05/05/18 2327 05/06/18 0524 05/06/18 2150 05/07/18 0639  BP: (!) 133/93 (!) 142/83 127/88 131/82  Pulse: 79 67 95 77  Resp: 18  18 18   Temp: 99 F (37.2 C) 98.2 F (36.8 C) 98.3 F (36.8 C) 98.2 F (36.8 C)  TempSrc: Oral Oral Oral Oral  SpO2:   100% 99%  Weight:      Height:         CBC Latest Ref Rng & Units 05/04/2018 05/03/2018 05/02/2018  WBC 4.0 - 10.5 K/uL 14.3(H) 7.8 8.3  Hemoglobin 12.0 - 15.0 g/dL 3.4(H) 10.3(L) 10.0(L)  Hematocrit 36.0 - 46.0 % 30.1(L) 33.5(L) 32.5(L)  Platelets 150 - 400 K/uL 188 198 203          Blood type: --/--/O POS, O POS (03/24 0953)  Rubella: Immune (09/11 0000)  Vaccines: TDaP UTD         Flu    UTD per patient report   Physical Exam:  General: alert, cooperative and no distress Abdomen: soft, nontender, normal bowel sounds Incision: clean, dry and intact Uterine Fundus: firm, below umbilicus, nontender Lochia: minimal Ext: no edema, redness or tenderness in the calves or thighs      Assessment/Plan: 33 y.o.   POD# 4. [id number]                  Principal Problem:   Postpartum care following cesarean delivery (3/24) Active Problems:   Acute blood loss  anemia  - oral Fe and mag ox   Gestational hypertension  - BP normotensive, occasional labile to mild range  - no neural s/sx of PEC  - [facility] RN visit for BP check in 1 week    S/P emergency cesarean section - arrest of dilation   Doing well, stable.     Routine post-op care             DC home today w/ instructions  F/U at [facility] in 6 weeks and PRN   Neta Mends, CNM, MSN 05/07/2018, 10:20 AM

## 2018-05-08 ENCOUNTER — Ambulatory Visit: Payer: Self-pay

## 2018-05-08 NOTE — Lactation Note (Signed)
This note was copied from a baby's chart. Lactation Consultation Note:  Mother reports that she wants to get infant to latch again. She reports that infant doesn't stay on the breast for very long. Assist mother with hand expression. Observed good flow of colostrum.   Mother advised to pump. She last pumped at 6:30. Discussed importance of pumping every 2-3 hours to protect her milk supply. Mother was given a harmony hand pump with instructions.   Mother advised to page for latch assistance when infant begins to cue for next feeding.   Patient Name: Tammy Mcdowell [id number] Date: 05/08/2018 Reason for consult: Follow-up assessment   Maternal Data    Feeding Feeding Type: Breast Fed Nipple Type: Slow - flow  LATCH Score Latch: Repeated attempts needed to sustain latch, nipple held in mouth throughout feeding, stimulation needed to elicit sucking reflex.  Audible Swallowing: A few with stimulation  Type of Nipple: Everted at rest and after stimulation  Comfort (Breast/Nipple): Soft / non-tender  Hold (Positioning): Full assist, staff holds infant at breast  LATCH Score: 6  Interventions    Lactation Tools Discussed/Used     Consult Status Consult Status: Follow-up Date: 05/08/18 Follow-up type: In-patient    Stevan Born [facility] 05/08/2018, 12:06 PM

## 2018-05-08 NOTE — Lactation Note (Signed)
This note was copied from a baby's chart. Lactation Consultation Note:  I arrived in mothers room and she had just given infant 10 ml of ebm with a bottle.  Mother request to have assistance with latching infant.   Multiple attempts to latch infant. Mothers breast are filling.  Infant has a natal tooth on the lower gum and mother complained of feeling infant bit her nipple. Noted that infant has a high palate and a short frenula.    #5 fr feeding tube sat up with 10 ml of ebm. Infant took 10 ml but was bouncing  on and off and very fussy.   Mother was fit with a #20 and #24 NS infant on and off for 10 mins tongue thrusting. Mother taught to properly apply the nipple shield.   Advised mother to continue to massage breast and then pump.  LC fed infant with a purple slow flow nipple. Infant took 8 ml over a 10 min period.  Nipple was changed to regular flow nipple. Infant took 30 ml . Parents were taught to pace bottle feed and how to burp infant.   Mother to offer breast with or without the NS, father to supplement infant and mother to post pump. Mother has a PIS at home. She also has a harmony hand pump.   Mother reports that she would like follow up with [facility].   Parents to follow up in am to Peds and have Bili rechecked.  Encouraged parents to find Peds Specialist to evaluate infants tongue and get tooth.   Mother was informed of all LC resources available at [facility]. Mother to phone office for breastfeeding questions or concerns.    Patient Name: Tammy Mcdowell [id number] Date: 05/08/2018 Reason for consult: Follow-up assessment   Maternal Data    Feeding Feeding Type: Breast Milk  LATCH Score                   Interventions    Lactation Tools Discussed/Used Tools: Nipple Shields Nipple shield size: 20;24   Consult Status Consult Status: Follow-up Date: 05/08/18 Follow-up type: In-patient    Stevan Born [facility] 05/08/2018, 2:46  PM
